# Patient Record
Sex: Female | Born: 1975
Health system: Southern US, Community
[De-identification: ages and names within clinical notes are randomized; demographics above are authoritative.]

## PROBLEM LIST (undated history)

## (undated) DIAGNOSIS — I10 Essential (primary) hypertension: Secondary | ICD-10-CM

## (undated) DIAGNOSIS — G43909 Migraine, unspecified, not intractable, without status migrainosus: Secondary | ICD-10-CM

## (undated) DIAGNOSIS — G4700 Insomnia, unspecified: Secondary | ICD-10-CM

## (undated) DIAGNOSIS — F419 Anxiety disorder, unspecified: Secondary | ICD-10-CM

## (undated) DIAGNOSIS — F329 Major depressive disorder, single episode, unspecified: Secondary | ICD-10-CM

## (undated) DIAGNOSIS — F32A Depression, unspecified: Secondary | ICD-10-CM

## (undated) DIAGNOSIS — E559 Vitamin D deficiency, unspecified: Secondary | ICD-10-CM

## (undated) DIAGNOSIS — L659 Nonscarring hair loss, unspecified: Secondary | ICD-10-CM

## (undated) HISTORY — DX: Vitamin D deficiency, unspecified: E55.9

## (undated) HISTORY — DX: Migraine, unspecified, not intractable, without status migrainosus: G43.909

## (undated) HISTORY — DX: Nonscarring hair loss, unspecified: L65.9

## (undated) HISTORY — DX: Insomnia, unspecified: G47.00

## (undated) HISTORY — DX: Essential (primary) hypertension: I10

## (undated) HISTORY — DX: Depression, unspecified: F32.A

## (undated) HISTORY — DX: Anxiety disorder, unspecified: F41.9

## (undated) HISTORY — DX: Major depressive disorder, single episode, unspecified: F32.9

---

## 2008-12-26 ENCOUNTER — Ambulatory Visit: Payer: Self-pay | Admitting: Internal Medicine

## 2008-12-26 DIAGNOSIS — J309 Allergic rhinitis, unspecified: Secondary | ICD-10-CM | POA: Insufficient documentation

## 2008-12-26 DIAGNOSIS — R51 Headache: Secondary | ICD-10-CM

## 2008-12-26 DIAGNOSIS — R519 Headache, unspecified: Secondary | ICD-10-CM | POA: Insufficient documentation

## 2008-12-26 DIAGNOSIS — I10 Essential (primary) hypertension: Secondary | ICD-10-CM | POA: Insufficient documentation

## 2008-12-26 LAB — CONVERTED CEMR LAB
ALT: <8 units/L (ref 0–35)
BUN: 12 mg/dL (ref 6–23)
Basophils Absolute: 0 10*3/uL (ref 0.0–0.1)
Basophils Relative: 0 % (ref 0–1)
Bilirubin, Direct: <0.1 mg/dL (ref 0.0–0.3)
Calcium: 9.1 mg/dL (ref 8.4–10.5)
Cholesterol: 209 mg/dL — ABNORMAL HIGH (ref 0–200)
Creatinine, Ser: 0.65 mg/dL (ref 0.40–1.20)
Eosinophils Absolute: 0.1 10*3/uL (ref 0.0–0.7)
Eosinophils Relative: 2 % (ref 0–5)
Glucose, Bld: 95 mg/dL (ref 70–99)
HCT: 36.8 % (ref 36.0–46.0)
Hemoglobin: 12.2 g/dL (ref 12.0–15.0)
MCHC: 33.2 g/dL (ref 30.0–36.0)
Monocytes Absolute: 0.3 10*3/uL (ref 0.1–1.0)
Potassium: 4.1 meq/L (ref 3.5–5.3)
RDW: 13 % (ref 11.5–15.5)
VLDL: 22 mg/dL (ref 0–40)

## 2008-12-29 ENCOUNTER — Encounter: Payer: Self-pay | Admitting: Internal Medicine

## 2009-01-05 LAB — CONVERTED CEMR LAB
Catecholamines Tot(E+NE) 24 Hr U: 0.059 mg/24hr
Dopamine 24 Hr Urine: 456 mcg/24hr (ref <500)
Metanephrines, Ur: 153 (ref 36–190)

## 2009-02-06 ENCOUNTER — Ambulatory Visit: Payer: Self-pay | Admitting: Internal Medicine

## 2009-12-28 ENCOUNTER — Telehealth: Payer: Self-pay | Admitting: Internal Medicine

## 2010-01-01 LAB — CONVERTED CEMR LAB: Pap Smear: ABNORMAL

## 2010-03-08 ENCOUNTER — Ambulatory Visit: Payer: Self-pay | Admitting: Internal Medicine

## 2010-03-08 DIAGNOSIS — R000 Tachycardia, unspecified: Secondary | ICD-10-CM | POA: Insufficient documentation

## 2010-03-08 LAB — CONVERTED CEMR LAB
BUN: 12 mg/dL (ref 6–23)
CO2: 28 meq/L (ref 19–32)
Chloride: 100 meq/L (ref 96–112)
Creatinine, Ser: 0.67 mg/dL (ref 0.40–1.20)
Free T4: 1.08 ng/dL (ref 0.80–1.80)

## 2010-03-09 ENCOUNTER — Encounter: Payer: Self-pay | Admitting: Internal Medicine

## 2010-03-09 ENCOUNTER — Telehealth: Payer: Self-pay | Admitting: Internal Medicine

## 2010-03-09 DIAGNOSIS — Z8639 Personal history of other endocrine, nutritional and metabolic disease: Secondary | ICD-10-CM

## 2010-03-10 ENCOUNTER — Ambulatory Visit: Payer: Self-pay | Admitting: Diagnostic Radiology

## 2010-03-10 ENCOUNTER — Ambulatory Visit (HOSPITAL_BASED_OUTPATIENT_CLINIC_OR_DEPARTMENT_OTHER): Admission: RE | Admit: 2010-03-10 | Discharge: 2010-03-10 | Payer: Self-pay | Admitting: Internal Medicine

## 2010-03-15 ENCOUNTER — Telehealth: Payer: Self-pay | Admitting: Internal Medicine

## 2010-03-16 ENCOUNTER — Ambulatory Visit: Payer: Self-pay | Admitting: Internal Medicine

## 2010-04-08 ENCOUNTER — Telehealth: Payer: Self-pay | Admitting: Internal Medicine

## 2010-05-17 ENCOUNTER — Ambulatory Visit: Admit: 2010-05-17 | Payer: Self-pay | Admitting: Internal Medicine

## 2010-05-18 NOTE — Progress Notes (Signed)
Summary: MRI result  Phone Note Outgoing Call   Summary of Call: [facility] for pt to call back re:  MRI of Brain results Initial call taken by: [name],  March 15, 2010 4:40 PM  Follow-up for Phone Call        call pt -  MRI of brain showed Nonspecific cerebral white matter signal changes.  these changes are sometimes seen in pts with hx of chronic migraines.  who is her headache specialist? once you find out, plz fax copy of MRI of brain to HA specialist.   Follow-up by: [name],  March 15, 2010 5:33 PM  Additional Follow-up for Phone Call Additional follow up Details #1::        Left message on machine to return my call. Nicki Guadalajara Fergerson CMA Duncan Dull)  March 16, 2010 8:41 AM     Additional Follow-up for Phone Call Additional follow up Details #2::    patient in office today for follow up appointment. She states she does not have a headache specialist that she sees. She is requesting a copy of the CT films. She was adivsed a copy of the report would be provided to her in office, and she was referred to radiology to obtain a copy of the films. Follow-up by: Glendell Docker CMA,  March 16, 2010 2:39 PM

## 2010-05-18 NOTE — Letter (Signed)
Summary: Generic Letter  [place] [facility]  [address]   [place], [place] [address]   Phone: [phone]  Fax: [phone]    11/29/201    NOVEMBER SYPHER [address] [place], [place]  [address]      Dear Ms. POSPISIL,  There have been several unsuccessful attempts to contact you regarding lab results and additional information needed. When you receive this letter, please contact our office at [phone]. If there is no one available to take your call, please leave a phone number where you can be reached.  Thank you in advance for your cooperation.         Sincerely,   Glendell Docker CMA Dr Thomos Lemons

## 2010-05-18 NOTE — Progress Notes (Signed)
Summary: Medication Alternative  Phone Note Refill Request Message from:  Fax from Pharmacy on December 28, 2009 1:54 PM  Refills Requested: Medication #1:  VERAMYST 27.5 MCG/SPRAY SUSP 1 spray each nostril once daily   Dosage confirmed as above?Dosage Confirmed   Brand Name Necessary? No   Supply Requested: 1 month   Last Refilled: 12/23/2009 [facility] [address] [place] [address] PHONE [phone] FAX [phone]  THIS MED IS TOO EXPENSIVE PATIENT WOULD LIKE A GENERIC OR CHEAPER ALTERNATIVE    Method Requested: Electronic Next Appointment Scheduled: NONE Initial call taken by: Roselle Locus,  December 28, 2009 1:56 PM  Follow-up for Phone Call        It has been almost 1 yr since prev visit.  we can not prescribe w/o OV Follow-up by: [name],  December 28, 2009 2:46 PM  Additional Follow-up for Phone Call Additional follow up Details #1::        Rx denial called/faxed to pharmacy, patient will need office visit per Dr Artist Pais instructions. Call placed to patient at  [phone], no answer. A voice message was left informing patient office visit was needed Additional Follow-up by: Glendell Docker CMA,  December 28, 2009 2:50 PM

## 2010-05-18 NOTE — Assessment & Plan Note (Signed)
Summary: HA/HEA   Vital Signs:  Patient profile:   35 year old female Height:      62.5 inches Weight:      162.25 pounds BMI:     29.31 O2 Sat:      99 % on Room air Temp:     98.4 degrees F oral Pulse rate:   104 / minute BP sitting:   130 / 86  (right arm) Cuff size:   regular  Vitals Entered By: Glendell Docker CMA (March 08, 2010 3:28 PM)  O2 Flow:  Room air CC: Headache Is Patient Diabetic? No Pain Assessment Patient in pain? no        Primary Care Provider:  Dondra Spry DO  CC:  Headache.  History of Present Illness: 56 AA female with hx of chronic migraines c/o right side head pain, kenalog injection in the base of neck and 2 areas on the sides of her head done on Friday, she states she has constant pain when she moves her head, looks down, or cough she has increased pain she also reports  episode of vertigo lasting a couple of hours,  no vomiting  Allergies (verified): No Known Drug Allergies  Past History:  Past Medical History: Headache Hypertension Allergic rhinitis   Insomnia  Past Surgical History: Denies surgical history     Family History: Family History of Arthritis Family History of CAD Female 1st degree relative <50 Family History Diabetes 1st degree relative Family History Hypertension Family History of Stroke M 1st degree relative <50      Social History: Occupation: Clinical biochemist Rep Married 11 years   1 daughter age 25   Physical Exam  General:  alert, well-developed, and well-nourished.   Head:  normocephalic and atraumatic.  right sided scalp tenderness.  no redness or swelling Eyes:  pupils equal, pupils round, and pupils reactive to light.  bilateral exopthalmos Ears:  R ear normal and L ear normal.   Mouth:  pharynx pink and moist.   Lungs:  normal respiratory effort and normal breath sounds.   Heart:  normal rate, regular rhythm, and no gallop.   Neurologic:  cranial nerves II-XII intact, gait normal, DTRs  symmetrical and normal, and finger-to-nose normal.     Impression & Recommendations:  Problem # 1:  HEADACHE (ICD-784.0) pt with right sided headache.  worse than usual migraine.  rule out intracranial lesion obtain MRI of brain  Her updated medication list for this problem includes:    Imitrex 100 Mg Tabs (Sumatriptan succinate) ..... One tablet by mouth at onset of migraine may reapeat 2 hours later if needed    Aleve 220 Mg Tabs (Naproxen sodium) .Marland Kitchen... 1 tabs by mouth bid  Orders: T-Sed Rate (Automated) ([id number]) Radiology Referral (Radiology)  Problem # 2:  HYPERTENSION (ICD-401.9) Assessment: Unchanged  Her updated medication list for this problem includes:    Lisinopril 20 Mg Tabs (Lisinopril) .Marland Kitchen... Take 1 tablet by mouth once a day  Orders: T-Basic Metabolic Panel ([phone]) T-TSH [id number]) T-T4, Free ([phone])  BP today: 130/86 Prior BP: 152/100 (12/26/2008)  Labs Reviewed: K+: 4.1 (12/26/2008) Creat: : 0.65 (12/26/2008)   Chol: 209 (12/26/2008)   HDL: 60 (12/26/2008)   LDL: 127 (12/26/2008)   TG: 108 (12/26/2008)  Problem # 3:  UNSPECIFIED TACHYCARDIA (ICD-785.0) repeat thyroid studies  Orders: T-TSH ([id number]) T-T4, Free ([id number])  Complete Medication List: 1)  Fluticasone Propionate 50 Mcg/act Susp (Fluticasone propionate) .... One spray each nostril as needed once daily 2)  Tri-previfem 0.18/0.215/0.25 Mg-35 Mcg Tabs (Norgestim-eth estrad triphasic) .... Take 1 tablet by mouth once a day 3)  Zolpidem Tartrate 10 Mg Tabs (Zolpidem tartrate) .... Take 1 tab by mouth at bedtime as needed 4)  Imitrex 100 Mg Tabs (Sumatriptan succinate) .... One tablet by mouth at onset of migraine may reapeat 2 hours later if needed 5)  Kenalog 10 Mg/ml Susp (Triamcinolone acetonide) .... Injection every other month for alopecia 6)  Lisinopril 20 Mg Tabs (Lisinopril) .... Take 1 tablet by mouth once a day 7)  Cyclobenzaprine Hcl 5 Mg Tabs (Cyclobenzaprine  hcl) .... One by mouth three times a day 8)  Aleve 220 Mg Tabs (Naproxen sodium) .Marland Kitchen.. 1 tabs by mouth bid  Patient Instructions: 1)  Please schedule a follow-up appointment in 1 week Prescriptions: CYCLOBENZAPRINE HCL 5 MG TABS (CYCLOBENZAPRINE HCL) one by mouth three times a day  #30 x 0   Entered and Authorized by:   [name]   Signed by:   [name] on 03/08/2010   Method used:   Electronically to        [facility] (retail)       [address]       [place], [place]  [address]       Ph: [phone]       Fax: [phone]   RxID:   [id number]    Orders Added: 1)  T-Sed Rate (Automated) [[id number]] 2)  T-Basic Metabolic Panel [id number] 3)  T-TSH [[id number]] 4)  T-T4, Free [[id number]] 5)  Radiology Referral [Radiology] 6)  Est. Patient Level IV [[address]3818]   Immunization History:  Influenza Immunization History:    Influenza:  declined (03/08/2010)   Contraindications/Deferment of Procedures/Staging:    Test/Procedure: FLU VAX    Reason for deferment: patient declined   Immunization History:  Influenza Immunization History:    Influenza:  Declined (03/08/2010)    Preventive Care Screening  Last Tetanus Booster:    Date:  03/08/2010    Results:  Current  Pap Smear:    Date:  01/01/2010    Results:  abnormal    Current Allergies (reviewed today): No known allergies

## 2010-05-18 NOTE — Progress Notes (Signed)
Summary: Lab Results  Phone Note Outgoing Call   Summary of Call: call pt - blood test shows pt may be hyperthyroid. Initial call taken by: [name],  March 09, 2010 9:15 AM  Follow-up for Phone Call        I suggest additional thyroid tests - thyroid iodine uptake scan  Also call lab and see if they can add thyroid antibodies Follow-up by: [name],  March 09, 2010 9:16 AM  Additional Follow-up for Phone Call Additional follow up Details #1::        Thyroid testing added, call placed to patient at [phone], no answer. A voice message was left for patient to return phone call. Additional Follow-up by: Glendell Docker CMA,  March 09, 2010 9:49 AM  New Problems: HYPERTHYROIDISM (ICD-242.90)   Additional Follow-up for Phone Call Additional follow up Details #2::    Left message on machine to return my call. Nicki Guadalajara Fergerson CMA Duncan Dull)  March 10, 2010 9:23 AM   Left message on machine to return my call. Nicki Guadalajara Fergerson CMA Duncan Dull)  March 16, 2010 8:41 AM   Additional Follow-up for Phone Call Additional follow up Details #3:: Details for Additional Follow-up Action Taken: no return call from patient  Glendell Docker CMA  March 16, 2010 10:40 AM   New Problems: HYPERTHYROIDISM (ICD-242.90)

## 2010-05-20 NOTE — Assessment & Plan Note (Signed)
Summary: 1 WEEK FOLLOW UP/MHF   Vital Signs:  Patient profile:   35 year old female Height:      62.5 inches Weight:      165 pounds BMI:     29.81 O2 Sat:      98 % on Room air Temp:     99.7 degrees F oral Pulse rate:   108 / minute Resp:     18 per minute BP sitting:   140 / 90  (right arm) Cuff size:   regular  Vitals Entered By: Glendell Docker CMA (March 16, 2010 2:32 PM)  O2 Flow:  Room air CC: 1 Week Follow up  Is Patient Diabetic? No Pain Assessment Patient in pain? no      Comments does not have a headache specialist, medication is working well   Primary Care Bricia Taher:  [name]  CC:  1 Week Follow up .  History of Present Illness: 35 y/o female for f/u re: headaches symptoms improved with use of muscle relaxer  IMPRESSION: 1.  Nonspecific cerebral white matter signal changes.  This appearance has been described as sequelae of migraines, with other differential considerations including accelerated/hereditary small vessel ischemia, sequelae of trauma, hypercoagulable state, vasculitis, prior infection or demyelination. 2.  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 3.  Proptosis of unknown etiology, no findings typical of thyroid eye disease.  recent blood work also shows mildly suppressed TSH pt notes her previous physicicans has be concerned about protosis and possible hyperthyroidism but TFTs have always been normal in the past  Preventive Screening-Counseling & Management  Alcohol-Tobacco     Smoking Status: never  Allergies: No Known Drug Allergies  Past History:  Past Medical History: Headache Hypertension Allergic rhinitis   Insomnia  Past Surgical History: Denies surgical history     Family History: Family History of Arthritis Family History of CAD Female 1st degree relative <50 Family History Diabetes 1st degree relative Family History Hypertension Family History of Stroke M 1st  degree relative <50      Social History: 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   Eyes:  bilateral proptosis Lungs:  normal respiratory effort and normal breath sounds.   Heart:  normal rate, regular rhythm, and no gallop.   Neurologic:  cranial nerves II-XII intact and gait normal.     Impression & Recommendations:  Problem # 1:  HEADACHE (ICD-784.0) Assessment Improved Pt with chronic headaches - improved with muscle relaxer IMPRESSION: 1.  Nonspecific cerebral white matter signal changes.  This appearance has been described as sequelae of migraines, with other differential considerations including accelerated/hereditary small vessel ischemia, sequelae of trauma, hypercoagulable state, vasculitis, prior infection or demyelination. 2.  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 3.  Proptosis of unknown etiology, no findings typical of thyroid eye disease.  MRI of brain findings likely explained by hx of migraines refer to headache clinic / neuro to see whether pt needs additional work up re  MRI findings  Her updated medication list for this problem includes:    Imitrex 100 Mg Tabs (Sumatriptan succinate) ..... One tablet by mouth at onset of migraine may reapeat 2 hours later if needed    Aleve 220 Mg Tabs (Naproxen sodium) .Marland Kitchen... 1 tabs by mouth bid    Metoprolol Succinate 25 Mg Xr24h-tab (Metoprolol succinate) .Marland KitchenMarland KitchenMarland KitchenMarland Kitchen  1/2 by mouth once daily x 2 weeks, then 1 by mouth once daily  Orders: Headache Clinic Referral (Headache)  Problem # 2:  HYPERTENSION (ICD-401.9) Assessment: Unchanged  still suboptimal.  add b blocker esp considering  HA and tachycardia  Her updated medication list for this problem includes:    Lisinopril 20 Mg Tabs (Lisinopril) .Marland Kitchen... Take 1 tablet by mouth once a day    Metoprolol Succinate 25 Mg  Xr24h-tab (Metoprolol succinate) .Marland Kitchen... 1/2 by mouth once daily x 2 weeks, then 1 by mouth once daily  BP today: 140/90 Prior BP: 152/100 (12/26/2008)  Labs Reviewed: K+: 4.3 (03/08/2010) Creat: : 0.67 (03/08/2010)   Chol: 209 (12/26/2008)   HDL: 60 (12/26/2008)   LDL: 127 (12/26/2008)   TG: 108 (12/26/2008)  Future Orders: T-Basic Metabolic Panel [id number]) ... 05/10/2010  Problem # 3:  HYPERTHYROIDISM (ICD-242.90) Assessment: New transient thyroiditis vs graves thyroid antibodie are negative awaiting thyroid uptake scan  Her updated medication list for this problem includes:    Metoprolol Succinate 25 Mg Xr24h-tab (Metoprolol succinate) .Marland Kitchen... 1/2 by mouth once daily x 2 weeks, then 1 by mouth once daily  Labs Reviewed: TSH: 0.252 (03/08/2010)     Complete Medication List: 1)  Fluticasone Propionate 50 Mcg/act Susp (Fluticasone propionate) .... One spray each nostril as needed once daily 2)  Tri-previfem 0.18/0.215/0.25 Mg-35 Mcg Tabs (Norgestim-eth estrad triphasic) .... Take 1 tablet by mouth once a day 3)  Zolpidem Tartrate 10 Mg Tabs (Zolpidem tartrate) .... Take 1 tab by mouth at bedtime as needed 4)  Imitrex 100 Mg Tabs (Sumatriptan succinate) .... One tablet by mouth at onset of migraine may reapeat 2 hours later if needed 5)  Kenalog 10 Mg/ml Susp (Triamcinolone acetonide) .... Injection every other month for alopecia 6)  Lisinopril 20 Mg Tabs (Lisinopril) .... Take 1 tablet by mouth once a day 7)  Cyclobenzaprine Hcl 5 Mg Tabs (Cyclobenzaprine hcl) .... One by mouth three times a day 8)  Aleve 220 Mg Tabs (Naproxen sodium) .Marland Kitchen.. 1 tabs by mouth bid 9)  Metoprolol Succinate 25 Mg Xr24h-tab (Metoprolol succinate) .... 1/2 by mouth once daily x 2 weeks, then 1 by mouth once daily  Other Orders: Future Orders: T-Lipid Profile ([id number]) ... 05/10/2010 TLB-CRP-High Sensitivity (C-Reactive Protein) ([id number]) ... 05/10/2010  Patient Instructions: 1)  Please schedule  a follow-up appointment in 2 months. 2)  BMP prior to visit, ICD-9: 401.9 3)  Lipid Panel prior to visit, ICD-9: 401.9 4)  High sensitivity CRP :  272.4 5)  Please return for lab work one (1) week before your next appointment.  Prescriptions: METOPROLOL SUCCINATE 25 MG XR24H-TAB (METOPROLOL SUCCINATE) 1/2 by mouth once daily x 2 weeks, then 1 by mouth once daily  #30 x 2   Entered and Authorized by:   [name]   Signed by:   [name] on 03/16/2010   Method used:   Electronically to        [facility] (retail)       [address]       [place], [place]  [address]       Ph: [phone]       Fax: [phone]   RxID:   [id number]    Orders Added: 1)  Headache Clinic Referral [Headache] 2)  T-Basic Metabolic Panel [[id number]] 3)  T-Lipid Profile [[id number]] 4)  TLB-CRP-High Sensitivity (C-Reactive Protein) [86140-FCRP] 5)  Est. Patient Level III [id number]

## 2010-05-20 NOTE — Progress Notes (Signed)
Summary: Thyroid Testing  Phone Note Call from Patient Call back at [phone]   Caller: Patient Call For: [name] Summary of Call: patient called stating that she declined to have thyroid testing done, and is aksing about the lab charges for the bill that she received. She was informed that she that had been contacted  with several messages that have been left, and no return call, along with a letter in the mail. She states she knew that she would be coming in for an appointment, but she states she spoke to someone regarding her lab work, and informed them that she did not want to have the testing done. Of note patient does not remember whom she spoke with.  She states that she had a conversation with Dr Artist Pais regarding the testing, and stated it was discussed in detail why she declined to have thyroid testing done. Patient was reminded there were several messages left unanswered. She would like to know what thyroid testing she had done. Initial call taken by: Glendell Docker CMA,  April 08, 2010 3:08 PM  Follow-up for Phone Call        my recollection of conversation was that I advised pt thyroid antibodies be added because of abnormal TSH. If pt has issue with bill, I suggest we reverse charges for thyroid antibodies only Follow-up by: [name],  April 08, 2010 5:16 PM  Additional Follow-up for Phone Call Additional follow up Details #1::         call placed to [facility] at [phone], spoke with  [facility] with client billing she stated that she would not be able to take a verbal credit requst over the phone, I would have to contact our Client Customer Service Rep. Gae Dry also stated that insurance did not pay on $10. 74 of bill, so no refund would be due.  Call was placed to [facility] at [phone], no answer. A detailed voice message was left informing Vicky of request for Credit  accession number [id number] per Dr Artist Pais. Call was returned to patient at [phone] answer. A  detailed voice message was left informing patient, process has been started to issue a credit for thyroid antibodies. Additional Follow-up by: Glendell Docker CMA,  April 09, 2010 8:59 AM

## 2010-07-12 ENCOUNTER — Emergency Department (HOSPITAL_BASED_OUTPATIENT_CLINIC_OR_DEPARTMENT_OTHER)
Admission: EM | Admit: 2010-07-12 | Discharge: 2010-07-13 | Disposition: A | Discharge status: Home / Self Care | Payer: 59 | Attending: Emergency Medicine | Admitting: Emergency Medicine

## 2010-07-12 DIAGNOSIS — I10 Essential (primary) hypertension: Secondary | ICD-10-CM | POA: Insufficient documentation

## 2010-07-12 DIAGNOSIS — R220 Localized swelling, mass and lump, head: Secondary | ICD-10-CM | POA: Insufficient documentation

## 2010-07-12 DIAGNOSIS — X58XXXA Exposure to other specified factors, initial encounter: Secondary | ICD-10-CM | POA: Insufficient documentation

## 2010-07-12 DIAGNOSIS — T783XXA Angioneurotic edema, initial encounter: Secondary | ICD-10-CM | POA: Insufficient documentation

## 2010-07-12 DIAGNOSIS — Y9229 Other specified public building as the place of occurrence of the external cause: Secondary | ICD-10-CM | POA: Insufficient documentation

## 2011-03-14 ENCOUNTER — Encounter: Payer: Self-pay | Admitting: Internal Medicine

## 2011-03-14 ENCOUNTER — Ambulatory Visit (INDEPENDENT_AMBULATORY_CARE_PROVIDER_SITE_OTHER): Payer: 59 | Admitting: Internal Medicine

## 2011-03-14 VITALS — BP 132/80 | HR 104 | Temp 98.4°F | Resp 18 | Ht 62.0 in | Wt 167.0 lb

## 2011-03-14 DIAGNOSIS — J029 Acute pharyngitis, unspecified: Secondary | ICD-10-CM | POA: Insufficient documentation

## 2011-03-14 NOTE — Assessment & Plan Note (Signed)
Rapid strep obtained and negative. Suspect viral etiology currently. Continue otc sx relief prn. Followup if no improvement or worsening.

## 2011-03-14 NOTE — Progress Notes (Signed)
  Subjective:    Patient ID: Lindsey Burton, female    DOB: Jul 04, 1975, 35 y.o.   MRN: [id number]  HPI Pt presents to clinic for evaluation of sore throat. Notes 2day h/o ST, dry and irritated. No know strep exposure but has had general sick exposure. Denies f/c, myalgias, cough. Mild improvement with theraful or robutussin. No other alleviating or exacerbating factors.  Reviewed pmh, medications and allergies  Review of Systems see hpi     Objective:   Physical Exam  Nursing note and vitals reviewed. Constitutional: She appears well-developed and well-nourished. No distress.  HENT:  Head: Normocephalic and atraumatic.  Right Ear: External ear normal.  Left Ear: External ear normal.  Nose: Nose normal.  Mouth/Throat: Uvula is midline and mucous membranes are normal. Posterior oropharyngeal erythema present. No oropharyngeal exudate, posterior oropharyngeal edema or tonsillar abscesses.  Eyes: Conjunctivae are normal. Right eye exhibits discharge. Left eye exhibits no discharge. No scleral icterus.  Neck: Neck supple.  Neurological: She is alert.  Skin: Skin is warm and dry. She is not diaphoretic.  Psychiatric: She has a normal mood and affect.          Assessment & Plan:

## 2011-03-16 ENCOUNTER — Ambulatory Visit (INDEPENDENT_AMBULATORY_CARE_PROVIDER_SITE_OTHER): Payer: 59 | Admitting: Family

## 2011-03-16 ENCOUNTER — Encounter: Payer: Self-pay | Admitting: Family

## 2011-03-16 DIAGNOSIS — J329 Chronic sinusitis, unspecified: Secondary | ICD-10-CM

## 2011-03-16 DIAGNOSIS — I10 Essential (primary) hypertension: Secondary | ICD-10-CM

## 2011-03-16 MED ORDER — AMOXICILLIN-POT CLAVULANATE 875-125 MG PO TABS: 1.0000 | ORAL_TABLET | Freq: Two times a day (BID) | ORAL | Status: AC

## 2011-03-16 MED ORDER — AMLODIPINE BESYLATE 5 MG PO TABS: 5.0000 mg | ORAL_TABLET | Freq: Every day | ORAL | Status: DC

## 2011-03-16 NOTE — Patient Instructions (Signed)

## 2011-03-16 NOTE — Assessment & Plan Note (Signed)
Deteriorated.  Add amlodipine, She is instructed to follow up in 2 weeks and avoid sudafed containing products.

## 2011-03-16 NOTE — Assessment & Plan Note (Signed)
Plan to treat with augmentin.

## 2011-03-16 NOTE — Progress Notes (Signed)
  Subjective:    Patient ID: Lindsey Burton, female    DOB: 1976-01-28, 35 y.o.   MRN: [id number]  HPI  [name] is a 35 yr old female who presents today with chief complaint of facial pain and sinus pressure.  Tried alka selzer, robitussin without improvement.  Yellow/green nasal drainage.  Denies ear pain/appetite.     Review of Systems See HPI  No past medical history on file.  History   Social History  . Marital Status: Married    Spouse Name: N/A    Number of Children: N/A  . Years of Education: N/A   Occupational History  . Not on file.   Social History Main Topics  . Smoking status: Never Smoker   . Smokeless tobacco: Never Used  . Alcohol Use: No  . Drug Use: No  . Sexually Active: Not on file   Other Topics Concern  . Not on file   Social History Narrative  . No narrative on file    No past surgical history on file.  No family history on file.  Allergies  Allergen Reactions  . Lisinopril Swelling    Current Outpatient Prescriptions on File Prior to Visit  Medication Sig Dispense Refill  . fluticasone (FLONASE) 50 MCG/ACT nasal spray Place 2 sprays into the nose daily.        . metoprolol (TOPROL-XL) 100 MG 24 hr tablet Take 100 mg by mouth daily.        . SUMAtriptan (IMITREX) 100 MG tablet Take 100 mg by mouth every 2 (two) hours as needed.        . Triamcinolone Acetonide (KENALOG IJ) Inject as directed. Every 3 months for Alopecia       . zolpidem (AMBIEN CR) 12.5 MG CR tablet Take 12.5 mg by mouth at bedtime as needed.          BP 164/118  Pulse 110  Temp(Src) 98 F (36.7 C) (Oral)  Resp 18  Wt 165 lb 1.9 oz (74.898 kg)  SpO2 98%  LMP 02/28/2011       Objective:   Physical Exam  Constitutional: She appears well-developed and well-nourished. No distress.  HENT:  Head: Normocephalic and atraumatic.  Right Ear: Tympanic membrane and ear canal normal.  Left Ear: Tympanic membrane and ear canal normal.       + frontal and  maxillary sinus tenderness to palpation.   Cardiovascular: Normal rate and regular rhythm.   No murmur heard. Pulmonary/Chest: Effort normal and breath sounds normal. No respiratory distress. She has no wheezes. She has no rales. She exhibits no tenderness.  Musculoskeletal: She exhibits no edema.  Psychiatric: She has a normal mood and affect. Her behavior is normal. Judgment and thought content normal.          Assessment & Plan:

## 2012-02-27 ENCOUNTER — Ambulatory Visit (INDEPENDENT_AMBULATORY_CARE_PROVIDER_SITE_OTHER): Payer: 59 | Admitting: Internal Medicine

## 2012-02-27 ENCOUNTER — Encounter: Payer: Self-pay | Admitting: Internal Medicine

## 2012-02-27 VITALS — BP 164/102 | HR 114 | Temp 98.3°F | Resp 16 | Wt 169.0 lb

## 2012-02-27 DIAGNOSIS — I10 Essential (primary) hypertension: Secondary | ICD-10-CM

## 2012-02-27 DIAGNOSIS — J329 Chronic sinusitis, unspecified: Secondary | ICD-10-CM

## 2012-02-27 MED ORDER — AMOXICILLIN-POT CLAVULANATE 875-125 MG PO TABS: 1.0000 | ORAL_TABLET | Freq: Two times a day (BID) | ORAL | Status: DC

## 2012-02-27 NOTE — Progress Notes (Signed)
  Subjective:    Patient ID: Lindsey Burton, female    DOB: [birth year], 36 y.o.   MRN: [id number]  HPI patient presents to clinic for evaluation of possible sinusitis. Notes a day history of bilateral maxillary sinus pain and pressure, sore throat, bilateral ear fullness and cough productive for green brown sputum without hemoptysis. Denies fever or chills. Is taking over-the-counter cold medication. Reviewed elevated blood pressure. States has not taken blood pressure medication for the past several days. Has a primary care physician that follows her blood pressure and is scheduled for followup in approximately one week.  PMH:HTN No past surgical history on file.  reports that she has never smoked. She has never used smokeless tobacco. She reports that she does not drink alcohol or use illicit drugs. family history is not on file. Allergies  Allergen Reactions  . Lisinopril Swelling     Review of Systems see hpi     Objective:   Physical Exam  Nursing note and vitals reviewed. Constitutional: She appears well-developed and well-nourished. No distress.  HENT:  Head: Normocephalic and atraumatic.  Right Ear: External ear normal.  Left Ear: External ear normal.  Nose: Right sinus exhibits maxillary sinus tenderness. Right sinus exhibits no frontal sinus tenderness. Left sinus exhibits maxillary sinus tenderness. Left sinus exhibits no frontal sinus tenderness.  Mouth/Throat: Oropharynx is clear and moist.  Eyes: Conjunctivae normal and EOM are normal. No scleral icterus.  Neck: Neck supple.  Pulmonary/Chest: Effort normal and breath sounds normal. No respiratory distress. She has no wheezes. She has no rales.  Skin: Skin is warm and dry. She is not diaphoretic.  Psychiatric: She has a normal mood and affect.          Assessment & Plan:

## 2012-02-27 NOTE — Assessment & Plan Note (Signed)
Asymptomatic. Likely contributed by her sinusitis, cold medication and lack of antihypertensive medication for several days. Recommend resuming medication and followup with PCP closely.

## 2012-02-27 NOTE — Assessment & Plan Note (Signed)
Begin seven-day course of Augmentin. Followup if no improvement or worsening.

## 2013-02-18 ENCOUNTER — Ambulatory Visit (INDEPENDENT_AMBULATORY_CARE_PROVIDER_SITE_OTHER): Payer: 59 | Admitting: Family

## 2013-02-18 ENCOUNTER — Other Ambulatory Visit: Payer: Self-pay | Admitting: Family

## 2013-02-18 ENCOUNTER — Encounter: Payer: Self-pay | Admitting: Family

## 2013-02-18 VITALS — BP 130/60 | HR 67 | Temp 98.1°F | Resp 16 | Ht 63.0 in | Wt 166.1 lb

## 2013-02-18 DIAGNOSIS — F32A Depression, unspecified: Secondary | ICD-10-CM | POA: Insufficient documentation

## 2013-02-18 DIAGNOSIS — E0590 Thyrotoxicosis, unspecified without thyrotoxic crisis or storm: Secondary | ICD-10-CM

## 2013-02-18 DIAGNOSIS — F411 Generalized anxiety disorder: Secondary | ICD-10-CM

## 2013-02-18 DIAGNOSIS — G43909 Migraine, unspecified, not intractable, without status migrainosus: Secondary | ICD-10-CM

## 2013-02-18 DIAGNOSIS — L659 Nonscarring hair loss, unspecified: Secondary | ICD-10-CM

## 2013-02-18 DIAGNOSIS — F329 Major depressive disorder, single episode, unspecified: Secondary | ICD-10-CM | POA: Insufficient documentation

## 2013-02-18 DIAGNOSIS — F419 Anxiety disorder, unspecified: Secondary | ICD-10-CM

## 2013-02-18 DIAGNOSIS — I10 Essential (primary) hypertension: Secondary | ICD-10-CM

## 2013-02-18 DIAGNOSIS — Z0000 Encounter for general adult medical examination without abnormal findings: Secondary | ICD-10-CM | POA: Insufficient documentation

## 2013-02-18 LAB — BASIC METABOLIC PANEL WITH GFR
BUN: 11 mg/dL (ref 6–23)
CO2: 29 mEq/L (ref 19–32)
Chloride: 102 mEq/L (ref 96–112)
Glucose, Bld: 81 mg/dL (ref 70–99)
Potassium: 4.2 mEq/L (ref 3.5–5.3)

## 2013-02-18 LAB — TSH: TSH: 0.781 u[IU]/mL (ref 0.350–4.500)

## 2013-02-18 LAB — LIPID PANEL
Cholesterol: 204 mg/dL — ABNORMAL HIGH (ref 0–200)
VLDL: 22 mg/dL (ref 0–40)

## 2013-02-18 LAB — CBC WITH DIFFERENTIAL/PLATELET
Basophils Relative: 0 % (ref 0–1)
Eosinophils Absolute: 0.1 10*3/uL (ref 0.0–0.7)
HCT: 39.7 % (ref 36.0–46.0)
Hemoglobin: 13.2 g/dL (ref 12.0–15.0)
MCH: 29.1 pg (ref 26.0–34.0)
MCHC: 33.2 g/dL (ref 30.0–36.0)
MCV: 87.6 fL (ref 78.0–100.0)
Monocytes Absolute: 0.5 10*3/uL (ref 0.1–1.0)
Monocytes Relative: 5 % (ref 3–12)

## 2013-02-18 LAB — T3, FREE: T3, Free: 2.9 pg/mL (ref 2.3–4.2)

## 2013-02-18 LAB — HEPATIC FUNCTION PANEL
ALT: 9 U/L (ref 0–35)
AST: 11 U/L (ref 0–37)
Albumin: 4.3 g/dL (ref 3.5–5.2)
Alkaline Phosphatase: 126 U/L — ABNORMAL HIGH (ref 39–117)
Bilirubin, Direct: 0.1 mg/dL (ref 0.0–0.3)

## 2013-02-18 LAB — T4, FREE: Free T4: 1.04 ng/dL (ref 0.80–1.80)

## 2013-02-18 MED ORDER — ZOLPIDEM TARTRATE ER 12.5 MG PO TBCR: 12.5000 mg | EXTENDED_RELEASE_TABLET | Freq: Every evening | ORAL | Status: DC | PRN

## 2013-02-18 MED ORDER — SUMATRIPTAN SUCCINATE 100 MG PO TABS: ORAL_TABLET | ORAL | Status: DC

## 2013-02-18 MED ORDER — FLUTICASONE PROPIONATE 50 MCG/ACT NA SUSP: 2.0000 | Freq: Every day | NASAL | Status: DC

## 2013-02-18 MED ORDER — METOPROLOL SUCCINATE ER 100 MG PO TB24: 100.0000 mg | ORAL_TABLET | Freq: Every day | ORAL | Status: DC

## 2013-02-18 NOTE — Assessment & Plan Note (Signed)
I think she would benefit from meeting with a therapist.  She is open to the idea but does not think that she has the time to do so but will think about it and let me know if she would like to proceed.

## 2013-02-18 NOTE — Progress Notes (Signed)
Subjective:    Patient ID: Lindsey Burton, female    DOB: 03-Mar-1976, 37 y.o.   MRN: [id number]  HPI  Reports hx of exopthalmos and decreased TSH 2011.    Patient presents today for complete physical.  Immunizations:declines flu shot, last tetanus 2010.  Diet: poor eats too much fried food Exercise: stationary bike 3 times a week 25-30 minutes.   Pap Smear: normal 2013. Mammogram: never  Migraines- reports 9 migraines a month. Improved since she has been working part time.  HTN- she is maintained on metoprolol.    Anxiety- reports family has advised her to seek eval for anxiety.  Reports she is generally happy but can "fly off the handle."  Reports that all of the family comes to her with all her problems.  Reports that she raises her voice.  Reports that this is rare.  Has excessive worry about her autistic daughter.  Reports that she uses ambien PRN.    Alopecia- sees Derm for kenalog injections- Sees Derm in [place].    Review of Systems  HENT: Negative for hearing loss and rhinorrhea.   Eyes:       Has eye apt.  Reports that her vision is getting worse  Respiratory: Negative for cough and shortness of breath.   Cardiovascular: Negative for chest pain.  Genitourinary: Negative for dysuria.  Musculoskeletal: Negative for arthralgias and myalgias.  Neurological: Positive for headaches.   Past Medical History  Diagnosis Date  . Hypertension   . Migraines     History   Social History  . Marital Status: Married    Spouse Name: N/A    Number of Children: N/A  . Years of Education: N/A   Occupational History  . Not on file.   Social History Main Topics  . Smoking status: Never Smoker   . Smokeless tobacco: Never Used  . Alcohol Use: No  . Drug Use: No  . Sexual Activity: Not on file   Other Topics Concern  . Not on file   Social History Narrative   Daughter, age 67 autistic   Works at [employer]   Enjoys TV   No pets    History reviewed. No  pertinent past surgical history.  Family History  Problem Relation Age of Onset  . Hypertension Father   . Diabetes Father   . Hyperlipidemia Father   . Heart attack Father   . Diabetes Sister   . Cancer Neg Hx     Allergies  Allergen Reactions  . Lisinopril Swelling    Current Outpatient Prescriptions on File Prior to Visit  Medication Sig Dispense Refill  . amLODipine (NORVASC) 5 MG tablet Take 1 tablet (5 mg total) by mouth daily.  30 tablet  0  . amoxicillin-clavulanate (AUGMENTIN) 875-125 MG per tablet Take 1 tablet by mouth 2 (two) times daily.  14 tablet  0  . Triamcinolone Acetonide (KENALOG IJ) Inject as directed. Every 3 months for Alopecia        No current facility-administered medications on file prior to visit.    BP 130/60  Pulse 67  Temp(Src) 98.1 F (36.7 C) (Oral)  Resp 16  Ht 5\' 3"  (1.6 m)  Wt 166 lb 1.3 oz (75.333 kg)  BMI 29.43 kg/m2  SpO2 99%  LMP 01/26/2013        Objective:   Physical Exam  Physical Exam  Constitutional: She is oriented to person, place, and time. She appears well-developed and well-nourished. No distress.  HENT:  Head: Normocephalic and atraumatic.  Right Ear: Tympanic membrane and ear canal normal.  Left Ear: Tympanic membrane and ear canal normal.  Mouth/Throat: Oropharynx is clear and moist.  Eyes: Pupils are equal, round, and reactive to light. No scleral icterus. + bilateral Exopthalmos Neck: Normal range of motion. No thyromegaly present.  Cardiovascular: Normal rate and regular rhythm.   No murmur heard. Pulmonary/Chest: Effort normal and breath sounds normal. No respiratory distress. He has no wheezes. She has no rales. She exhibits no tenderness.  Abdominal: Soft. Bowel sounds are normal. He exhibits no distension and no mass. There is no tenderness. There is no rebound and no guarding.  Musculoskeletal: She exhibits no edema.  Lymphadenopathy:    She has no cervical adenopathy.  Neurological: She is alert  and oriented to person, place, and time. She exhibits normal muscle tone. Coordination normal.  Skin: Skin is warm and dry.  Psychiatric: She has a normal mood and affect. Her behavior is normal. Judgment and thought content normal.  Breasts: Examined lying Right: Without masses, retractions, discharge or axillary adenopathy.  Left: Without masses, retractions, discharge or axillary adenopathy.  Pelvic deferred         Assessment & Plan:         Assessment & Plan:

## 2013-02-18 NOTE — Assessment & Plan Note (Signed)
Trial of imitrex, continue beta blocker.

## 2013-02-18 NOTE — Assessment & Plan Note (Signed)
Receiving kenalog injections with derm and reports improvement.

## 2013-02-18 NOTE — Patient Instructions (Signed)
Please complete lab work prior to leaving. Follow up in 3 months.  

## 2013-02-18 NOTE — Assessment & Plan Note (Signed)
BP Readings from Last 3 Encounters:  02/18/13 130/60  02/27/12 164/102  03/16/11 164/118   BP stable today.  Continue beta blocker.

## 2013-02-18 NOTE — Assessment & Plan Note (Signed)
Continue exercise. Discussed healthy diet, weight loss.  Obtain Mammogram, plan pap next year.  Discussed BSE, obtain fasting lab work.

## 2013-02-18 NOTE — Assessment & Plan Note (Signed)
Check TFTs today

## 2013-02-19 LAB — URINALYSIS, ROUTINE W REFLEX MICROSCOPIC
Ketones, ur: NEGATIVE mg/dL
Nitrite: NEGATIVE
Protein, ur: NEGATIVE mg/dL
Specific Gravity, Urine: 1.021 (ref 1.005–1.030)
Urobilinogen, UA: 0.2 mg/dL (ref 0.0–1.0)

## 2013-02-19 LAB — LIPID PANEL
Cholesterol: 205 mg/dL — ABNORMAL HIGH (ref 0–200)
LDL Cholesterol: 142 mg/dL — ABNORMAL HIGH (ref 0–99)
Total CHOL/HDL Ratio: 4.8 Ratio
VLDL: 20 mg/dL (ref 0–40)

## 2013-02-22 ENCOUNTER — Telehealth: Payer: Self-pay | Admitting: *Deleted

## 2013-02-22 NOTE — Telephone Encounter (Signed)
Per note from Provider, correction to below results: LFT's elevated (alk phos), possible related to gallbladder. Repeat LFT in 1 month, dx elevated alk. Phos.  Left message for pt to return my call.  Result Note    Please call pt and let her know that kidney function, electrolytes, sugar, blood count, urine, liver testing are all normal. Cholesterol mildly elevated. Work on low fat/low cholesterol diet and exercise. HIV screen neg.

## 2013-02-25 NOTE — Telephone Encounter (Signed)
Notified pt and she voices understanding. Lab order entered. Pt states she just passed a kidney stone and wanted to know if that could have caused her elevated alk. Phos?

## 2013-05-20 ENCOUNTER — Ambulatory Visit: Payer: 59 | Admitting: Family

## 2013-08-11 ENCOUNTER — Emergency Department (HOSPITAL_BASED_OUTPATIENT_CLINIC_OR_DEPARTMENT_OTHER)
Admission: EM | Admit: 2013-08-11 | Discharge: 2013-08-11 | Disposition: A | Discharge status: Home / Self Care | Payer: 59 | Attending: Emergency Medicine | Admitting: Emergency Medicine

## 2013-08-11 ENCOUNTER — Encounter (HOSPITAL_BASED_OUTPATIENT_CLINIC_OR_DEPARTMENT_OTHER): Payer: Self-pay | Admitting: Emergency Medicine

## 2013-08-11 DIAGNOSIS — R209 Unspecified disturbances of skin sensation: Secondary | ICD-10-CM | POA: Insufficient documentation

## 2013-08-11 DIAGNOSIS — T450X4A Poisoning by antiallergic and antiemetic drugs, undetermined, initial encounter: Secondary | ICD-10-CM | POA: Insufficient documentation

## 2013-08-11 DIAGNOSIS — T50901A Poisoning by unspecified drugs, medicaments and biological substances, accidental (unintentional), initial encounter: Secondary | ICD-10-CM

## 2013-08-11 DIAGNOSIS — G43909 Migraine, unspecified, not intractable, without status migrainosus: Secondary | ICD-10-CM | POA: Insufficient documentation

## 2013-08-11 DIAGNOSIS — IMO0002 Reserved for concepts with insufficient information to code with codable children: Secondary | ICD-10-CM | POA: Insufficient documentation

## 2013-08-11 DIAGNOSIS — I10 Essential (primary) hypertension: Secondary | ICD-10-CM | POA: Insufficient documentation

## 2013-08-11 DIAGNOSIS — J3489 Other specified disorders of nose and nasal sinuses: Secondary | ICD-10-CM | POA: Insufficient documentation

## 2013-08-11 DIAGNOSIS — T44901A Poisoning by unspecified drugs primarily affecting the autonomic nervous system, accidental (unintentional), initial encounter: Secondary | ICD-10-CM | POA: Insufficient documentation

## 2013-08-11 DIAGNOSIS — R002 Palpitations: Secondary | ICD-10-CM | POA: Insufficient documentation

## 2013-08-11 DIAGNOSIS — Y9389 Activity, other specified: Secondary | ICD-10-CM | POA: Insufficient documentation

## 2013-08-11 DIAGNOSIS — R000 Tachycardia, unspecified: Secondary | ICD-10-CM | POA: Insufficient documentation

## 2013-08-11 DIAGNOSIS — T4591XA Poisoning by unspecified primarily systemic and hematological agent, accidental (unintentional), initial encounter: Secondary | ICD-10-CM | POA: Insufficient documentation

## 2013-08-11 DIAGNOSIS — H0019 Chalazion unspecified eye, unspecified eyelid: Secondary | ICD-10-CM | POA: Insufficient documentation

## 2013-08-11 DIAGNOSIS — Y929 Unspecified place or not applicable: Secondary | ICD-10-CM | POA: Insufficient documentation

## 2013-08-11 DIAGNOSIS — Z79899 Other long term (current) drug therapy: Secondary | ICD-10-CM | POA: Insufficient documentation

## 2013-08-11 DIAGNOSIS — T391X1A Poisoning by 4-Aminophenol derivatives, accidental (unintentional), initial encounter: Secondary | ICD-10-CM | POA: Insufficient documentation

## 2013-08-11 DIAGNOSIS — H0011 Chalazion right upper eyelid: Secondary | ICD-10-CM

## 2013-08-11 MED ORDER — SODIUM CHLORIDE 0.9 % IV BOLUS (SEPSIS)
1000.0000 mL | Freq: Once | INTRAVENOUS | Status: AC
Start: 2013-08-11 — End: 2013-08-11
  Administered 2013-08-11: 1000 mL via INTRAVENOUS

## 2013-08-11 NOTE — ED Notes (Signed)
Ambulated to BR w/o assist gait steady pt reports pain improved

## 2013-08-11 NOTE — Discharge Instructions (Signed)
Chalazion A chalazion is a swelling or hard lump on the eyelid caused by a blocked oil gland. Chalazions may occur on the upper or the lower eyelid.  CAUSES  Oil gland in the eyelid becomes blocked. SYMPTOMS   Swelling or hard lump on the eyelid. This lump may make it hard to see out of the eye.  The swelling may spread to areas around the eye. TREATMENT   Although some chalazions disappear by themselves in 1 or 2 months, some chalazions may need to be removed.  Medicines to treat an infection may be required. HOME CARE INSTRUCTIONS   Wash your hands often and dry them with a clean towel. Do not touch the chalazion.  Apply heat to the eyelid several times a day for 10 minutes to help ease discomfort and bring any yellowish white fluid (pus) to the surface. One way to apply heat to a chalazion is to use the handle of a metal spoon.  Hold the handle under hot water until it is hot, and then wrap the handle in paper towels so that the heat can come through without burning your skin.  Hold the wrapped handle against the chalazion and reheat the spoon handle as needed.  Apply heat in this fashion for 10 minutes, 4 times per day.  Return to your caregiver to have the pus removed if it does not break (rupture) on its own.  Do not try to remove the pus yourself by squeezing the chalazion or sticking it with a pin or needle.  Only take over-the-counter or prescription medicines for pain, discomfort, or fever as directed by your caregiver. SEEK IMMEDIATE MEDICAL CARE IF:   You have pain in your eye.  Your vision changes.  The chalazion does not go away.  The chalazion becomes painful, red, or swollen, grows larger, or does not start to disappear after 2 weeks. MAKE SURE YOU:   Understand these instructions.  Will watch your condition.  Will get help right away if you are not doing well or get worse. Document Released: 04/01/2000 Document Revised: 06/27/2011 Document Reviewed:  07/20/2009 [facility] Patient Information 2014 [place], [place].  Overdose, Adult A person can overdose on alcohol, drugs or both by accident or on purpose. If it was on purpose, it is a serious matter. Professional help should be sought. If the overdose was an accident, certain steps should be taken to make sure that it never happens again. ACCIDENTAL OVERDOSE Overdosing on prescription medications can be a result of:  Not understanding the instructions.  Misreading the label.  Forgetting that you took a dose and then taking another by mistake. This situation happens a lot. To make sure this does not happen again:  Clarify the correct dosage with your caregiver.  Place the correct dosage in a "pill-minder" container (labeled for each day and time of day).  Have someone dispense your medicine. Please be sure to follow-up with your primary care doctor as directed. INTENTIONAL OVERDOSE If the overdose was on purpose, it is a serious situation. Taking more than the prescribed amount of medications (including taking someone else's prescription), abusing street drugs or drinking an amount of alcohol that requires medical treatment can show a variety of possible problems. These may indicate you:  Are depressed or suicidal.  Are abusing drugs, took too much or combined different drugs to experiment with the effects.  Mixed alcohol with drugs and did not realize the danger of doing so (this is drug abuse).  Are suffering addiction  to drugs and/or alcohol (also known as chemical dependency).  Binge drink. If you have not been referred to a mental health professional for help, it is important that you get help right away. Only a professional can determine which problems may exist and what the best course of treatment may be. It is your responsibility to follow-up with further evaluation or treatment as directed.  Alcohol is responsible for a large number of overdoses and unintended deaths among  college-age young adults. Binge drinking is consuming 4-5 drinks in a short period of time. The amount of alcohol in standard servings of wine (5 oz.), beer (12 oz.) and distilled spirits (1.5 oz., 80 proof) is the same. Beer or wine can be just as dangerous to the binge drinker as "hard" liquor can be.  CONSEQUENCES OF BINGE DRINKING Alcohol poisoning is the most serious consequence of binge drinking. This is a severe and potentially fatal physical reaction to an alcohol overdose. When too much alcohol is consumed, the brain does not get enough oxygen. The lack of oxygen will eventually cause the brain to shut down the voluntary functions that regulate breathing and heart rate. Symptoms of alcohol poisoning include:  Vomiting.  Passing out (unconsciousness).  Cold, clammy, pale or bluish skin.  Slow or irregular breathing. WHAT SHOULD I DO NEXT? If you have a history of drug abuse or suffer chemical dependency (alcoholism, drug addiction or both), you might consider the following:  Talk with a qualified substance abuse counselor and consider entering a treatment program.  Go to a detox facility if necessary.  If you were attending self-help group meetings, consider returning to them and go often.  Explore other resources located near you (see sources listed below). If you are unsure if you have a substance abuse problem, ask yourself the following questions:  Have you been told by friends or family that drugs/alcohol has become a problem?  Do you get into fights when drinking or using drugs?  Do you have blackouts (not remembering what you do while using)?  Do you lie about use or amounts of drugs or alcohol you consume?  Do you need chemicals to get you going?  Do you suffer in work or school performance because of drug or alcohol use?  Do you get sick from drug or alcohol use but continue to use anyway?  Do you need drugs or alcohol to relate to people or feel comfortable in  social situations?  Do you use drugs or alcohol to forget problems? If you answered "Yes" to any of the above questions, it means you show signs of chemical dependency and a professional evaluation is suggested. The longer the use of drugs and alcohol continues, the problems will become greater. SEEK IMMEDIATE MEDICAL CARE IF:   You feel like you might repeat your problematic behavior.  You need someone to talk to and feel that it should not wait.  You feel you are a danger to yourself or someone else.  You feel like you are having a new reaction to medications you are taking, or you are getting worse after leaving a care center.  You have an overwhelming urge to drink or use drugs. Addiction cannot be cured, but it can be treated successfully. Treatment centers are listed in the yellow pages under: Cocaine, Narcotics, and Alcoholics Anonymous. Most hospitals and clinics can refer you to a specialized care center. The [place] government maintains a toll-free number for obtaining treatment referrals: 276 289 8814 or 760-310-1089 (TDD) and maintains  a website: http://findtreatment.Rock[place].plsamhsa.gov. Other websites for additional information are: www.mentalhealth.[place]sam[place]sa.gov. and [place]est[place]eling.tnwww.[place].gov. In [place] treatment resources are listed in each [place] with listings available under [employer] [facility] [employer] or similar titles. Document Released: 04/07/2003 Document Revised: 06/27/2011 Document Reviewed: 02/27/2008 [facility] Patient Information 2014 [place]Care, [place].

## 2013-08-11 NOTE — ED Provider Notes (Signed)
CSN: [id number]     Arrival date & time 08/11/13  1801 History  This chart was scribed for Rolan BuccoMelanie Arianny Pun, MD by Danella Maiersaroline Early, ED Scribe. This patient was seen in room MH08/MH08 and the patient's care was started at 6:17 PM.    Chief Complaint  Patient presents with  . Eye Pain  . Drug Overdose   The history is provided by the patient. No language interpreter was used.   HPI Comments: Lindsey Burton is a 38 y.o. female who presents to the Emergency Department complaining of accidental drug overdose on allergy medications while trying to treat swelling and irritation of the right eye. Pt has had 1.5 bottles of children's benadryl (442.5 mg) since yesterday, half of a bottle of children's zyrtec (30 mg) since yesterday, and a 10 mg loratadine tablet. She is now having numbness and tingling to bilateral hands and feet and rapid heart beat. She did not have any other medications, drugs, or alcohol.  She states her eye started tingling 2 days ago and started swelling that night. She reports irritation but no itching. She reports mild yellow discharge from the eye. No crust. She also reports rhinorrhea. She states she has had these symptoms before but it usually goes away with benadryl and zyrtec. She states she usually drinks half the bottle without measuring the dose. She denies fevers, visual changes, recent illness, vomiting, diarrhea. LMP 4/6.    Past Medical History  Diagnosis Date  . Hypertension   . Migraines    History reviewed. No pertinent past surgical history. Family History  Problem Relation Age of Onset  . Hypertension Father   . Diabetes Father   . Hyperlipidemia Father   . Heart attack Father   . Diabetes Sister   . Cancer Neg Hx    History  Substance Use Topics  . Smoking status: Never Smoker   . Smokeless tobacco: Never Used  . Alcohol Use: No   OB History   Grav Para Term Preterm Abortions TAB SAB Ect Mult Living                 Review of Systems   Constitutional: Negative for fever, chills, diaphoresis and fatigue.  HENT: Negative for congestion, rhinorrhea and sneezing.   Eyes: Positive for pain, discharge and redness. Negative for photophobia, itching and visual disturbance.  Respiratory: Negative for cough, chest tightness and shortness of breath.   Cardiovascular: Positive for palpitations. Negative for chest pain and leg swelling.  Gastrointestinal: Negative for nausea, vomiting, abdominal pain, diarrhea and blood in stool.  Genitourinary: Negative for frequency, hematuria, flank pain and difficulty urinating.  Musculoskeletal: Negative for arthralgias and back pain.  Skin: Negative for rash.  Neurological: Positive for numbness. Negative for dizziness, speech difficulty, weakness and headaches.      Allergies  Lisinopril  Home Medications   Prior to Admission medications   Medication Sig Start Date End Date Taking? Authorizing Provider  amLODipine (NORVASC) 5 MG tablet Take 1 tablet (5 mg total) by mouth daily. 03/16/11 03/15/12  Sandford CrazeMelissa O'Sullivan, NP  fluticasone (FLONASE) 50 MCG/ACT nasal spray Place 2 sprays into the nose daily. 02/18/13   Sandford CrazeMelissa O'Sullivan, NP  metoprolol succinate (TOPROL-XL) 100 MG 24 hr tablet Take 1 tablet (100 mg total) by mouth daily. 02/18/13   Sandford CrazeMelissa O'Sullivan, NP  SUMAtriptan (IMITREX) 100 MG tablet One tab at start of headache, may repeat 2 hours later as needed. Max 2 dose per 24 hrs 02/18/13   Sandford CrazeMelissa O'Sullivan, NP  Triamcinolone Acetonide (KENALOG IJ) Inject as directed. Every 3 months for Alopecia     Historical Provider, MD  zolpidem (AMBIEN CR) 12.5 MG CR tablet Take 1 tablet (12.5 mg total) by mouth at bedtime as needed. 02/18/13   Sandford CrazeMelissa O'Sullivan, NP   BP 176/113  Pulse 133  Temp(Src) 98.6 F (37 C) (Oral)  Resp 20  Ht 5\' 2"  (1.575 m)  Wt 164 lb (74.39 kg)  BMI 29.99 kg/m2  SpO2 100%  LMP 08/06/2013 Physical Exam  Constitutional: She is oriented to person, place, and  time. She appears well-developed and well-nourished.  HENT:  Head: Normocephalic and atraumatic.  Eyes: Pupils are equal, round, and reactive to light.  chalazion right upper eyelid with slight erythema and swelling around the area. No pain with EOM.   Neck: Normal range of motion. Neck supple.  Cardiovascular: Regular rhythm and normal heart sounds.  Tachycardia present.   Pulmonary/Chest: Effort normal and breath sounds normal. No respiratory distress. She has no wheezes. She has no rales. She exhibits no tenderness.  Abdominal: Soft. Bowel sounds are normal. There is no tenderness. There is no rebound and no guarding.  Musculoskeletal: Normal range of motion. She exhibits no edema.  Lymphadenopathy:    She has no cervical adenopathy.  Neurological: She is alert and oriented to person, place, and time.  Skin: Skin is warm and dry. No rash noted.  Psychiatric: She has a normal mood and affect.    ED Course  Procedures (including critical care time) Medications  sodium chloride 0.9 % bolus 1,000 mL (0 mLs Intravenous Stopped 08/11/13 2021)    DIAGNOSTIC STUDIES: Oxygen Saturation is 100% on RA, normal by my interpretation.    COORDINATION OF CARE: 6:47 PM- Discussed treatment plan with pt. Pt agrees to plan.    Labs Review Labs Reviewed - No data to display  Imaging Review No results found.   EKG Interpretation   Date/Time:  Sunday August 11 2013 18:57:01 EDT Ventricular Rate:  103 PR Interval:  166 QRS Duration: 94 QT Interval:  360 QTC Calculation: 471 R Axis:   44 Text Interpretation:  Sinus tachycardia Possible Left atrial enlargement  Cannot rule out Anterior infarct , age undetermined Abnormal ECG No old  tracing to compare Confirmed by Jannie Doyle  MD, Keerstin Bjelland ([id number]) on 08/11/2013  8:28:55 PM      MDM   Final diagnoses:  Chalazion of right upper eyelid  Medication overdose    Pt with chalazion to eye.  Advised hot compresses.  She has an optometrist to f/u  with if her symptoms are not improving in 2 days.  She is also having fracture of the excessive Benadryl ingestion. Poison control was contacted and advised monitoring for a couple hours. She had an EKG which shows a sinus rhythm. There is no arrhythmias noted. She was given IV fluids. Her symptoms improved with observation. Her heart rate came down into the 90s. Her blood pressure was elevated but she has a history of hypertension. She'll need a followup with her primary care physician for recheck.  I personally performed the services described in this documentation, which was scribed in my presence.  The recorded information has been reviewed and considered.   Rolan BuccoMelanie Anamaria Dusenbury, MD 08/11/13 2040

## 2013-08-11 NOTE — ED Notes (Signed)
[name]  She related an EKG would be helpful, along with NS bolus.  Dose that pt took is approximately 450 mg of benadryl which is just above dosage for adult over 24 hours.  30 mg of Zyrtec is not worrisome.  [name] will call back later regarding follow up.

## 2013-08-11 NOTE — ED Notes (Signed)
Swelling, irritation to right eye.  No known injury.  Has been taking children's allergy medication for the symptoms.  Drank a bottle and a half of children's Benadryl since yesterday (equivalent to 442.5 mg) and a half a bottle of children's cetirizine (approx 30 mg) since yesterday, and a loratadine 10 mg tablet.  Is reporting numbness/tingling to her hands and feeling like her throat is swelling.  Also used generic Flonase.

## 2013-08-15 ENCOUNTER — Telehealth: Payer: Self-pay | Admitting: Family

## 2013-08-15 NOTE — Telephone Encounter (Signed)
OK to send 30 tabs, but please let pt know that she is past due for follow up and will need ov prior to additional refills.

## 2013-08-15 NOTE — Telephone Encounter (Signed)
Refill- zolpidem °

## 2013-08-15 NOTE — Telephone Encounter (Signed)
Please advise refill? Last RX was done on 02-18-13 quantity 30 with 0 refills  Last OV was 02-18-13

## 2013-08-16 MED ORDER — ZOLPIDEM TARTRATE ER 12.5 MG PO TBCR: 12.5000 mg | EXTENDED_RELEASE_TABLET | Freq: Every evening | ORAL | Status: DC | PRN

## 2013-08-16 NOTE — Telephone Encounter (Signed)
Pt last seen in 02/2013 for thyroid, BP and anxiety and advised to follow up in 3 months. Pt is past due for follow up. Please call pt to arrange appt. 30 day supply zolpidem faxed to pharmacy.

## 2013-08-16 NOTE — Telephone Encounter (Signed)
Left message for patient to return my call.

## 2013-08-20 NOTE — Telephone Encounter (Signed)
Left message for patient to return my call.

## 2013-08-22 NOTE — Telephone Encounter (Signed)
Left message for patient to return my call.

## 2013-08-23 NOTE — Telephone Encounter (Signed)
Letter mailed to pt.  

## 2015-03-19 LAB — HM PAP SMEAR: HM PAP: NORMAL

## 2017-01-16 LAB — HM MAMMOGRAPHY: HM Mammogram: NORMAL (ref 0–4)

## 2017-03-31 ENCOUNTER — Ambulatory Visit: Payer: Self-pay | Admitting: Medical

## 2017-05-05 ENCOUNTER — Encounter: Payer: Self-pay | Admitting: Family

## 2017-06-02 ENCOUNTER — Ambulatory Visit (INDEPENDENT_AMBULATORY_CARE_PROVIDER_SITE_OTHER): Payer: Managed Care, Other (non HMO) | Admitting: Family

## 2017-06-02 ENCOUNTER — Encounter: Payer: Self-pay | Admitting: Family

## 2017-06-02 VITALS — BP 127/86 | HR 73 | Temp 98.2°F | Resp 16 | Ht 63.0 in | Wt 163.0 lb

## 2017-06-02 DIAGNOSIS — F418 Other specified anxiety disorders: Secondary | ICD-10-CM

## 2017-06-02 DIAGNOSIS — G43909 Migraine, unspecified, not intractable, without status migrainosus: Secondary | ICD-10-CM

## 2017-06-02 DIAGNOSIS — I10 Essential (primary) hypertension: Secondary | ICD-10-CM

## 2017-06-02 DIAGNOSIS — E559 Vitamin D deficiency, unspecified: Secondary | ICD-10-CM

## 2017-06-02 DIAGNOSIS — L659 Nonscarring hair loss, unspecified: Secondary | ICD-10-CM | POA: Diagnosis not present

## 2017-06-02 DIAGNOSIS — G4700 Insomnia, unspecified: Secondary | ICD-10-CM | POA: Diagnosis not present

## 2017-06-02 MED ORDER — ESCITALOPRAM OXALATE 10 MG PO TABS: ORAL_TABLET | ORAL | 0 refills | Status: DC

## 2017-06-02 MED ORDER — BUPROPION HCL ER (XL) 150 MG PO TB24: 150.0000 mg | ORAL_TABLET | Freq: Every day | ORAL | 1 refills | Status: DC

## 2017-06-02 MED ORDER — METOPROLOL SUCCINATE ER 100 MG PO TB24: 100.0000 mg | ORAL_TABLET | Freq: Every day | ORAL | 5 refills | Status: DC

## 2017-06-02 MED ORDER — ZOLPIDEM TARTRATE ER 12.5 MG PO TBCR: 12.5000 mg | EXTENDED_RELEASE_TABLET | Freq: Every evening | ORAL | 0 refills | Status: DC | PRN

## 2017-06-02 NOTE — Progress Notes (Signed)
Subjective:    Patient ID: Lindsey Burton, female    DOB: [birth year], 42 y.o.   MRN: [id number]  HPI  Ms. Pisani is a 42 yr old female who presents today to re-establish care. We last saw her back in 2019.  Pmhx is significant for:  1) HTN- She is maintaind on toprol xl 100mg .   BP Readings from Last 3 Encounters:  06/02/17 127/86  08/11/13 166/98  02/18/13 130/60   2) Depression- she is currently maintained on wellbutrin xl 300mg . Reports that she has been taking 1/2 tablet most days.   Reports "a lot of anxiety and a lot of stress."  Reports that symptoms worsened when her sister moved here from [place] and she was helping with her care.  Husband was gone for 2 years in [facility] working.  She reports that her mom was put in behavioral health.  Mom is now doing better. Sister passed away 1 year ago.  Feels that sometimes her depression is worse than others. Reports ongoing anxiety symptoms. Worries about how her daughter will be adjusting to adulthood (she is 18 and has autism).     3) Migraines- maintained on prn imitrex. Reports that migraines are much better. Used to have 9 a month and now only has 3 a month, generally around the time of her period.   4) Alopecia- receiving triamcinolon injections by dermatology Malva Cogan). [facility] dermatology.    5) Insomnia- maintained on ambien. Reports that she did not have success on lower doses.  Does not take on the weekends.    6) Vit D deficiency- has been on weekly x 1.5-2 years.  Reports that she has had her thyroid checked yearly and it has been normal Review of Systems    see HPI  Past Medical History:  Diagnosis Date  . Alopecia    Getting kenalog injections prn  . Anxiety   . Depression   . Hypertension   . Insomnia   . Migraines   . Migraines   . Vitamin D deficiency      Social History   Socioeconomic History  . Marital status: Married    Spouse name: Not on file  . Number of children: Not on file    . Years of education: Not on file  . Highest education level: Not on file  Social Needs  . Financial resource strain: Not on file  . Food insecurity - worry: Not on file  . Food insecurity - inability: Not on file  . Transportation needs - medical: Not on file  . Transportation needs - non-medical: Not on file  Occupational History  . Not on file  Tobacco Use  . Smoking status: Never Smoker  . Smokeless tobacco: Never Used  Substance and Sexual Activity  . Alcohol use: No  . Drug use: No  . Sexual activity: Not on file  Other Topics Concern  . Not on file  Social History Narrative   Daughter, age 64 autistic   Works at [employer]   Enjoys TV   No pets    No past surgical history on file.  Family History  Problem Relation Age of Onset  . Hypertension Father   . Diabetes Father   . Hyperlipidemia Father   . Heart attack Father   . Diabetes Sister   . Hypertension Sister   . Cancer Neg Hx     Allergies  Allergen Reactions  . Ace Inhibitors Swelling    angioedema   .  Lisinopril Swelling    Current Outpatient Medications on File Prior to Visit  Medication Sig Dispense Refill  . buPROPion (WELLBUTRIN XL) 300 MG 24 hr tablet Take 1 tablet by mouth daily.  0  . fluticasone (FLONASE) 50 MCG/ACT nasal spray Place 2 sprays into the nose daily. 16 g 5  . metoprolol succinate (TOPROL-XL) 100 MG 24 hr tablet Take 1 tablet (100 mg total) by mouth daily. 30 tablet 5  . SUMAtriptan (IMITREX) 100 MG tablet One tab at start of headache, may repeat 2 hours later as needed. Max 2 dose per 24 hrs 10 tablet 5  . Triamcinolone Acetonide (KENALOG IJ) Inject 60 mg as directed. As needed or Every 1 1/2 months for Alopecia    . Vitamin D, Ergocalciferol, (DRISDOL) 50000 units CAPS capsule Take 1 capsule by mouth once a week.    . zolpidem (AMBIEN CR) 12.5 MG CR tablet Take 1 tablet (12.5 mg total) by mouth at bedtime as needed. 30 tablet 0   No current facility-administered  medications on file prior to visit.     BP 127/86 (BP Location: Right Arm, Cuff Size: Large)   Pulse 73   Temp 98.2 F (36.8 C) (Oral)   Resp 16   Ht 5\' 3"  (1.6 m)   Wt 163 lb (73.9 kg)   LMP 05/12/2017   SpO2 100%   BMI 28.87 kg/m    Objective:   Physical Exam  Constitutional: She is oriented to person, place, and time. She appears well-developed and well-nourished.  HENT:  Head: Normocephalic and atraumatic.  exophthalmos noted   Eyes: Conjunctivae are normal.  Neck: No thyromegaly present.  Cardiovascular: Normal rate, regular rhythm and normal heart sounds.  No murmur heard. Pulmonary/Chest: Effort normal and breath sounds normal. No respiratory distress. She has no wheezes.  Lymphadenopathy:    She has no cervical adenopathy.  Neurological: She is alert and oriented to person, place, and time.  Skin: Skin is warm and dry.  Psychiatric: She has a normal mood and affect. Her behavior is normal. Judgment and thought content normal.          Assessment & Plan:  History of hyperthyroid-clinically stable.  We will plan to check follow-up TSH at her physical next month.  She reports that she has had normal annual TSHs.  Depression/anxiety-uncontrolled.  She is cutting her 300 mg extended release Wellbutrin in half.  She is only taking a half tab once daily.  I have advised her not to cut the extended release tabs.  We will change her to 150 mg extended release once daily.  Will add Lexapro 10 mg for more help with her anxiety and depression. I instructed pt to start 1/2 tablet once daily for 1 week and then increase to a full tablet once daily on week two as tolerated.  We discussed common side effects such as nausea, drowsiness and weight gain.  Also discussed rare but serious side effect of suicide ideation.  She is instructed to discontinue medication go directly to ED if this occurs.  Pt verbalizes understanding.  Plan follow up in 1 month to evaluate progress.     Migraines- migraines are much improved.  Perhaps due to addition of beta-blocker.  Continue as needed Imitrex as well as beta-blocker.  Alopecia-this is stable and is being managed by dermatology.  Hypertension-blood pressure is stable on current medication.  Continue same.  Vitamin D deficiency-plan to check vitamin D at her upcoming physical.  Insomnia-she is maintained  on zolpidem ER 12.5 mg once daily.  I have reviewed the controlled substance registry.  Last fill was on 05/18/2017.  A controlled substance contract is signed today and a urine drug screen will be obtained.   A total of 35  minutes were spent face-to-face with the patient during this encounter and over half of that time was spent on counseling and coordination of care. The patient was counseled on HTN, insomnia, controlled substance contract, depression/anxiety and its treatment.

## 2017-06-02 NOTE — Patient Instructions (Addendum)
Please complete lab work prior to leaving. Begin lexapro 1/2 tab once daily for 1 week, then increase to a full tab once daily on week two.

## 2017-06-03 LAB — PAIN MGMT, PROFILE 8 W/CONF, U
6 ACETYLMORPHINE: NEGATIVE ng/mL (ref <10)
AMPHETAMINES: NEGATIVE ng/mL (ref <500)
Alcohol Metabolites: NEGATIVE ng/mL (ref <500)
Benzodiazepines: NEGATIVE ng/mL (ref <100)
Buprenorphine, Urine: NEGATIVE ng/mL (ref <5)
COCAINE METABOLITE: NEGATIVE ng/mL (ref <150)
Creatinine: 170 mg/dL
MARIJUANA METABOLITE: NEGATIVE ng/mL (ref <20)
MDMA: NEGATIVE ng/mL (ref <500)
OPIATES: NEGATIVE ng/mL (ref <100)
OXIDANT: NEGATIVE ug/mL (ref <200)
OXYCODONE: NEGATIVE ng/mL (ref <100)
pH: 6.47 (ref 4.5–9.0)

## 2017-07-07 ENCOUNTER — Ambulatory Visit (INDEPENDENT_AMBULATORY_CARE_PROVIDER_SITE_OTHER): Payer: Managed Care, Other (non HMO) | Admitting: Family

## 2017-07-07 ENCOUNTER — Encounter: Payer: Self-pay | Admitting: Family

## 2017-07-07 VITALS — BP 143/88 | Temp 98.6°F | Resp 16 | Ht 63.0 in | Wt 163.8 lb

## 2017-07-07 DIAGNOSIS — F418 Other specified anxiety disorders: Secondary | ICD-10-CM | POA: Diagnosis not present

## 2017-07-07 DIAGNOSIS — Z0000 Encounter for general adult medical examination without abnormal findings: Secondary | ICD-10-CM

## 2017-07-07 DIAGNOSIS — Z0001 Encounter for general adult medical examination with abnormal findings: Secondary | ICD-10-CM

## 2017-07-07 DIAGNOSIS — J0100 Acute maxillary sinusitis, unspecified: Secondary | ICD-10-CM

## 2017-07-07 LAB — CBC WITH DIFFERENTIAL/PLATELET
BASOS ABS: 23 {cells}/uL (ref 0–200)
BASOS PCT: 0.3 %
EOS ABS: 101 {cells}/uL (ref 15–500)
EOS PCT: 1.3 %
HEMATOCRIT: 38.3 % (ref 35.0–45.0)
HEMOGLOBIN: 12.9 g/dL (ref 11.7–15.5)
LYMPHS ABS: 1973 {cells}/uL (ref 850–3900)
MCH: 29.5 pg (ref 27.0–33.0)
MCHC: 33.7 g/dL (ref 32.0–36.0)
MCV: 87.6 fL (ref 80.0–100.0)
MPV: 11.2 fL (ref 7.5–12.5)
Monocytes Relative: 6.7 %
NEUTROS ABS: 5179 {cells}/uL (ref 1500–7800)
Neutrophils Relative %: 66.4 %
Platelets: 239 10*3/uL (ref 140–400)
RBC: 4.37 10*6/uL (ref 3.80–5.10)
RDW: 12.6 % (ref 11.0–15.0)
Total Lymphocyte: 25.3 %
WBC mixed population: 523 cells/uL (ref 200–950)
WBC: 7.8 10*3/uL (ref 3.8–10.8)

## 2017-07-07 LAB — BASIC METABOLIC PANEL
BUN: 12 mg/dL (ref 7–25)
CALCIUM: 9.6 mg/dL (ref 8.6–10.2)
CO2: 26 mmol/L (ref 20–32)
Chloride: 104 mmol/L (ref 98–110)
Creat: 0.81 mg/dL (ref 0.50–1.10)
GLUCOSE: 94 mg/dL (ref 65–99)
Potassium: 5 mmol/L (ref 3.5–5.3)
SODIUM: 142 mmol/L (ref 135–146)

## 2017-07-07 LAB — URINALYSIS, ROUTINE W REFLEX MICROSCOPIC
BILIRUBIN URINE: NEGATIVE
GLUCOSE, UA: NEGATIVE
HGB URINE DIPSTICK: NEGATIVE
KETONES UR: NEGATIVE
LEUKOCYTES UA: NEGATIVE
Nitrite: NEGATIVE
PH: 6 (ref 5.0–8.0)
PROTEIN: NEGATIVE
Specific Gravity, Urine: 1.022 (ref 1.001–1.03)

## 2017-07-07 LAB — HEPATIC FUNCTION PANEL
AG RATIO: 1.2 (calc) (ref 1.0–2.5)
ALKALINE PHOSPHATASE (APISO): 82 U/L (ref 33–115)
ALT: 9 U/L (ref 6–29)
AST: 15 U/L (ref 10–30)
Albumin: 4.1 g/dL (ref 3.6–5.1)
BILIRUBIN TOTAL: 0.3 mg/dL (ref 0.2–1.2)
Bilirubin, Direct: 0.1 mg/dL (ref 0.0–0.2)
GLOBULIN: 3.4 g/dL (ref 1.9–3.7)
Indirect Bilirubin: 0.2 mg/dL (calc) (ref 0.2–1.2)
TOTAL PROTEIN: 7.5 g/dL (ref 6.1–8.1)

## 2017-07-07 LAB — LIPID PANEL
Cholesterol: 223 mg/dL — ABNORMAL HIGH (ref <200)
HDL: 75 mg/dL (ref >50)
LDL CHOLESTEROL (CALC): 124 mg/dL — AB
NON-HDL CHOLESTEROL (CALC): 148 mg/dL — AB (ref <130)
TRIGLYCERIDES: 127 mg/dL (ref <150)
Total CHOL/HDL Ratio: 3 (calc) (ref <5.0)

## 2017-07-07 LAB — TSH: TSH: 1.21 m[IU]/L

## 2017-07-07 MED ORDER — AMOXICILLIN-POT CLAVULANATE 875-125 MG PO TABS: 1.0000 | ORAL_TABLET | Freq: Two times a day (BID) | ORAL | 0 refills | Status: AC

## 2017-07-07 MED ORDER — NORGESTIM-ETH ESTRAD TRIPHASIC 0.18/0.215/0.25 MG-35 MCG PO TABS: 1.0000 | ORAL_TABLET | Freq: Every day | ORAL | 4 refills | Status: DC

## 2017-07-07 MED ORDER — ESCITALOPRAM OXALATE 10 MG PO TABS: 10.0000 mg | ORAL_TABLET | Freq: Every day | ORAL | 0 refills | Status: DC

## 2017-07-07 NOTE — Patient Instructions (Signed)
Please complete lab work prior to leaving.  Continue current dose of lexapro. Start augmentin for your sinus infection- call if symptoms worsen or if they are not improved in 2-3 days.

## 2017-07-07 NOTE — Progress Notes (Signed)
Subjective:    Patient ID: Lindsey Burton, female    DOB: [birth year], 42 y.o.   MRN: [id number]  HPI  Patient presents today for complete physical.  Immunizations: She believes that she had a tetanus 2 years ago- she will confirm with her previous provider office.  Diet: reports diet is improving Wt Readings from Last 3 Encounters:  07/07/17 163 lb 12.8 oz (74.3 kg)  06/02/17 163 lb (73.9 kg)  08/11/13 164 lb (74.4 kg)  Exercise:  Not exercising.  Pap Smear: due 03/18/18  Mammogram: 02/02/18 Vision: has an appointment next week Dental: up to date  Began with itchy throat, last Friday.  Reports that she the developed pain in her eye sockets and tooth pain, had some augmentin tabs that were old. This helped.   Depression/anxiety- reports that her anxiety is much better.  Mood is much better.  Reports that she was sleepy at first after starting lexapro, but this improved.     Review of Systems  Constitutional: Negative for unexpected weight change.  HENT: Positive for sinus pressure and sneezing.   Respiratory: Negative for cough.   Cardiovascular: Negative for leg swelling.  Gastrointestinal: Negative for constipation and diarrhea.  Genitourinary: Negative for dysuria, frequency and menstrual problem.  Musculoskeletal: Negative for arthralgias and myalgias.  Skin:       Some rashes intermittent which she attributes to anxiety, none today  Neurological: Negative for headaches.  Hematological: Negative for adenopathy.  Psychiatric/Behavioral:       See HPI   Past Medical History:  Diagnosis Date  . Alopecia    Getting kenalog injections prn  . Anxiety   . Depression   . Hypertension   . Insomnia   . Migraines   . Migraines   . Vitamin D deficiency      Social History   Socioeconomic History  . Marital status: Married    Spouse name: Not on file  . Number of children: Not on file  . Years of education: Not on file  . Highest education level: Not on file    Occupational History  . Not on file  Social Needs  . Financial resource strain: Not on file  . Food insecurity:    Worry: Not on file    Inability: Not on file  . Transportation needs:    Medical: Not on file    Non-medical: Not on file  Tobacco Use  . Smoking status: Never Smoker  . Smokeless tobacco: Never Used  Substance and Sexual Activity  . Alcohol use: No  . Drug use: No  . Sexual activity: Not on file  Lifestyle  . Physical activity:    Days per week: Not on file    Minutes per session: Not on file  . Stress: Not on file  Relationships  . Social connections:    Talks on phone: Not on file    Gets together: Not on file    Attends religious service: Not on file    Active member of club or organization: Not on file    Attends meetings of clubs or organizations: Not on file    Relationship status: Not on file  . Intimate partner violence:    Fear of current or ex partner: Not on file    Emotionally abused: Not on file    Physically abused: Not on file    Forced sexual activity: Not on file  Other Topics Concern  . 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  Family History  Problem Relation Age of Onset  . Hypertension Father   . Diabetes Father   . Hyperlipidemia Father   . Heart attack Father   . Diabetes Sister   . Hypertension Sister   . Cancer Neg Hx     Allergies  Allergen Reactions  . Ace Inhibitors Swelling    angioedema   . Lisinopril Swelling    Current Outpatient Medications on File Prior to Visit  Medication Sig Dispense Refill  . buPROPion (WELLBUTRIN XL) 150 MG 24 hr tablet Take 1 tablet (150 mg total) by mouth daily. 30 tablet 1  . fluticasone (FLONASE) 50 MCG/ACT nasal spray Place 2 sprays into the  nose daily. 16 g 5  . metoprolol succinate (TOPROL-XL) 100 MG 24 hr tablet Take 1 tablet (100 mg total) by mouth daily. 30 tablet 5  . SUMAtriptan (IMITREX) 100 MG tablet One tab at start of headache, may repeat 2 hours later as needed. Max 2 dose per 24 hrs 10 tablet 5  . Triamcinolone Acetonide (KENALOG IJ) Inject 60 mg as directed. As needed or Every 1 1/2 months for Alopecia    . Vitamin D, Ergocalciferol, (DRISDOL) 50000 units CAPS capsule Take 1 capsule by mouth once a week.    . zolpidem (AMBIEN CR) 12.5 MG CR tablet Take 1 tablet (12.5 mg total) by mouth at bedtime as needed. 30 tablet 0   No current facility-administered medications on file prior to visit.     BP (!) 143/88 (BP Location: Right Arm, Patient Position: Sitting, Cuff Size: Small)   Temp 98.6 F (37 C) (Oral)   Resp 16   Ht 5\' 3"  (1.6 m)   Wt 163 lb 12.8 oz (74.3 kg)   LMP 06/13/2017   SpO2 100%   BMI 29.02 kg/m       Objective:   Physical Exam  Physical Exam  Constitutional: She is oriented to person, place, and time. She appears well-developed and well-nourished. No distress.  HENT:  Head: Normocephalic and atraumatic.  Right Ear: Tympanic membrane and ear canal normal.  Left Ear: Tympanic membrane and ear canal normal.  Mouth/Throat: Oropharynx is clear and moist.  Eyes: Exam limited by colored contacts. + exopthalmos No scleral icterus.  Neck: Normal range of motion. No thyromegaly present.  Cardiovascular: Normal rate and regular rhythm.   No murmur heard. Pulmonary/Chest: Effort normal and breath sounds normal. No respiratory distress. He has no wheezes. She has no rales. She exhibits no tenderness.  Abdominal: Soft. Bowel sounds are normal. She exhibits no distension and no mass. There is no tenderness. There is no rebound and no guarding.  Musculoskeletal: She exhibits no edema.  Lymphadenopathy:    She has no cervical adenopathy.  Neurological: She is alert and oriented to person, place, and  time. She has normal patellar reflexes. She exhibits normal muscle tone. Coordination normal.  Skin: Skin is warm and dry.  Psychiatric: She has a normal mood and affect. Her behavior is normal. Judgment and thought content normal.  Breasts: Examined lying Right: Without masses, retractions, discharge or axillary adenopathy.  Left: Without masses, retractions, discharge or axillary adenopathy.  Pelvic: deferred  Assessment & Plan:         Assessment & Plan:  EKG tracing is personally reviewed.  EKG notes NSR- mild tachycardia.  No acute changes.   Depression/anxiety- improved. Continue current dose of lexapro.  Sinusitis- rx with augmentin.  Preventative care-discussed healthy diet and exercise.  She believes her tetanus is up-to-date and will obtain records.  She will be due for a Pap smear in December.  We will obtain routine lab work.

## 2017-07-07 NOTE — Progress Notes (Signed)
ek 

## 2017-07-09 ENCOUNTER — Encounter: Payer: Self-pay | Admitting: Family

## 2017-07-12 ENCOUNTER — Encounter: Payer: Managed Care, Other (non HMO) | Admitting: Family

## 2017-07-23 ENCOUNTER — Encounter: Payer: Self-pay | Admitting: Family

## 2017-07-31 ENCOUNTER — Other Ambulatory Visit: Payer: Self-pay | Admitting: Family

## 2017-09-16 HISTORY — PX: LIPOSUCTION: SHX10

## 2017-09-19 ENCOUNTER — Other Ambulatory Visit: Payer: Self-pay | Admitting: Family

## 2017-09-29 ENCOUNTER — Ambulatory Visit (INDEPENDENT_AMBULATORY_CARE_PROVIDER_SITE_OTHER): Payer: Managed Care, Other (non HMO) | Admitting: Family

## 2017-09-29 ENCOUNTER — Encounter: Payer: Self-pay | Admitting: Family

## 2017-09-29 ENCOUNTER — Ambulatory Visit: Payer: Managed Care, Other (non HMO) | Admitting: Family

## 2017-09-29 ENCOUNTER — Ambulatory Visit (HOSPITAL_BASED_OUTPATIENT_CLINIC_OR_DEPARTMENT_OTHER)
Admission: RE | Admit: 2017-09-29 | Discharge: 2017-09-29 | Disposition: A | Discharge status: Home / Self Care | Payer: Managed Care, Other (non HMO) | Source: Ambulatory Visit | Attending: Family | Admitting: Family

## 2017-09-29 VITALS — BP 120/80 | HR 74 | Temp 98.0°F | Resp 18 | Wt 168.2 lb

## 2017-09-29 DIAGNOSIS — F418 Other specified anxiety disorders: Secondary | ICD-10-CM | POA: Diagnosis not present

## 2017-09-29 DIAGNOSIS — I10 Essential (primary) hypertension: Secondary | ICD-10-CM

## 2017-09-29 DIAGNOSIS — G43909 Migraine, unspecified, not intractable, without status migrainosus: Secondary | ICD-10-CM | POA: Diagnosis not present

## 2017-09-29 DIAGNOSIS — Z01818 Encounter for other preprocedural examination: Secondary | ICD-10-CM | POA: Diagnosis not present

## 2017-09-29 DIAGNOSIS — G4700 Insomnia, unspecified: Secondary | ICD-10-CM | POA: Diagnosis not present

## 2017-09-29 LAB — CBC WITH DIFFERENTIAL/PLATELET
BASOS ABS: 0 10*3/uL (ref 0.0–0.1)
Basophils Relative: 0.3 % (ref 0.0–3.0)
Eosinophils Absolute: 0.1 10*3/uL (ref 0.0–0.7)
Eosinophils Relative: 1.5 % (ref 0.0–5.0)
HCT: 36.2 % (ref 36.0–46.0)
Hemoglobin: 11.9 g/dL — ABNORMAL LOW (ref 12.0–15.0)
LYMPHS ABS: 2.2 10*3/uL (ref 0.7–4.0)
Lymphocytes Relative: 29.3 % (ref 12.0–46.0)
MCHC: 32.7 g/dL (ref 30.0–36.0)
MCV: 89.4 fl (ref 78.0–100.0)
MONO ABS: 0.5 10*3/uL (ref 0.1–1.0)
Monocytes Relative: 6.4 % (ref 3.0–12.0)
NEUTROS ABS: 4.7 10*3/uL (ref 1.4–7.7)
NEUTROS PCT: 62.5 % (ref 43.0–77.0)
PLATELETS: 190 10*3/uL (ref 150.0–400.0)
RBC: 4.05 Mil/uL (ref 3.87–5.11)
RDW: 13.4 % (ref 11.5–15.5)
WBC: 7.5 10*3/uL (ref 4.0–10.5)

## 2017-09-29 LAB — BASIC METABOLIC PANEL
BUN: 9 mg/dL (ref 6–23)
CALCIUM: 8.9 mg/dL (ref 8.4–10.5)
CO2: 31 meq/L (ref 19–32)
Chloride: 104 mEq/L (ref 96–112)
Creatinine, Ser: 0.7 mg/dL (ref 0.40–1.20)
GFR: 116.95 mL/min (ref >60.00)
GLUCOSE: 123 mg/dL — AB (ref 70–99)
Potassium: 4.5 mEq/L (ref 3.5–5.1)
Sodium: 142 mEq/L (ref 135–145)

## 2017-09-29 LAB — HEPATIC FUNCTION PANEL
ALT: 12 U/L (ref 0–35)
AST: 17 U/L (ref 0–37)
Albumin: 3.8 g/dL (ref 3.5–5.2)
Alkaline Phosphatase: 103 U/L (ref 39–117)
BILIRUBIN TOTAL: 0.5 mg/dL (ref 0.2–1.2)
Bilirubin, Direct: 0.1 mg/dL (ref 0.0–0.3)
Total Protein: 6.5 g/dL (ref 6.0–8.3)

## 2017-09-29 LAB — PROTIME-INR
INR: 1 ratio (ref 0.8–1.0)
PROTHROMBIN TIME: 12.1 s (ref 9.6–13.1)

## 2017-09-29 MED ORDER — BUPROPION HCL ER (XL) 150 MG PO TB24: ORAL_TABLET | ORAL | 2 refills | Status: DC

## 2017-09-29 MED ORDER — ZOLPIDEM TARTRATE ER 12.5 MG PO TBCR: 12.5000 mg | EXTENDED_RELEASE_TABLET | Freq: Every evening | ORAL | 0 refills | Status: DC | PRN

## 2017-09-29 MED FILL — ZOLPIDEM TART ER 12.5 MG TA: 12.5 | 23 days supply | Qty: 15 | Fill #0

## 2017-09-29 NOTE — Patient Instructions (Signed)
Please complete lab work prior to leaving. Complete x-ray on the first floor.  

## 2017-09-29 NOTE — Progress Notes (Addendum)
Subjective:    Patient ID: Lindsey Burton, female    DOB: Oct 11, 1975, 42 y.o.   MRN: [id number]  HPI  Lindsey Burton is a 42 yr old female who presents today for pre-operative clearance. She will be having an upcoming cosmetic procedure under general anesthesia. She will be having some fat removal from her breasts/abdomen/flanks.  She also requests refills on her medications.  Insomnia- maintained on prn ambien. Finds this very helpful.    Anxiety/depression- reports mood is good on lexapro/wellbutrin.   Migraines- denies recent migraines.     Review of Systems    see HPI  Past Medical History:  Diagnosis Date  . Alopecia    Getting kenalog injections prn  . Anxiety   . Depression   . Hypertension   . Insomnia   . Migraines   . Migraines   . Vitamin D deficiency      Social History   Socioeconomic History  . Marital status: Married    Spouse name: Not on file  . Number of children: Not on file  . Years of education: Not on file  . Highest education level: Not on file  Occupational History  . Not on file  Social Needs  . Financial resource strain: Not on file  . Food insecurity:    Worry: Not on file    Inability: Not on file  . Transportation needs:    Medical: Not on file    Non-medical: Not on file  Tobacco Use  . Smoking status: Never Smoker  . Smokeless tobacco: Never Used  Substance and Sexual Activity  . Alcohol use: No  . Drug use: No  . Sexual activity: Not on file  Lifestyle  . Physical activity:    Days per week: Not on file    Minutes per session: Not on file  . Stress: Not on file  Relationships  . Social connections:    Talks on phone: Not on file    Gets together: Not on file    Attends religious service: Not on file    Active member of club or organization: Not on file    Attends meetings of clubs or organizations: Not on file    Relationship status: Not on file  . Intimate partner violence:    Fear of current or ex partner:  Not on file    Emotionally abused: Not on file    Physically abused: Not on file    Forced sexual activity: Not on file  Other Topics Concern  . 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 No pertinent surgical history.  Family History  Problem Relation Age of Onset  . Hypertension Father   . Diabetes Father   . Hyperlipidemia Father   . Heart attack Father   . Diabetes Sister   . Hypertension Sister   . Cancer Neg Hx     Allergies  Allergen Reactions  . Ace Inhibitors Swelling    angioedema   . Lisinopril Swelling    Current Outpatient Medications on File Prior to Visit  Medication Sig Dispense Refill  . escitalopram (LEXAPRO) 10 MG tablet Take 1 tablet by mouth daily. 90 tablet 1  . fluticasone (FLONASE) 50 MCG/ACT nasal  spray Place 2 sprays into the nose daily. 16 g 5  . metoprolol succinate (TOPROL-XL) 100 MG 24 hr tablet Take 1 tablet (100 mg total) by mouth daily. 30 tablet 5  . Norgestimate-Ethinyl Estradiol Triphasic 0.18/0.215/0.25 MG-35 MCG tablet Take 1 tablet by mouth daily. 3 Package 4  . SUMAtriptan (IMITREX) 100 MG tablet One tab at start of headache, may repeat 2 hours later as needed. Max 2 dose per 24 hrs 10 tablet 5  . Triamcinolone Acetonide (KENALOG IJ) Inject 60 mg as directed. As needed or Every 1 1/2 months for Alopecia    . Vitamin D, Ergocalciferol, (DRISDOL) 50000 units CAPS capsule Take 1 capsule by mouth once a week.     No current facility-administered medications on file prior to visit.     BP 120/80 (BP Location: Left Arm, Patient Position: Sitting, Cuff Size: Normal)   Pulse 74   Temp 98 F (36.7 C) (Oral)   Resp 18   Wt 168 lb 3.2 oz (76.3 kg)   SpO2 98%   BMI 29.80 kg/m    Objective:   Physical Exam    Constitutional: She appears well-developed and well-nourished.  Cardiovascular: Normal rate, regular rhythm and normal heart sounds.  No murmur heard. Pulmonary/Chest: Effort normal and breath sounds normal. No respiratory distress. She has no wheezes.  Abdominal: Soft. She exhibits no distension. There is no tenderness.  Skin: Skin is warm and dry.  Psychiatric: She has a normal mood and affect. Her behavior is normal. Judgment and thought content normal.          Assessment & Plan:  Pre-operative evaluation- will check routine lab work. EKG tracing is personally reviewed.  EKG notes NSR.  No acute changes. Obtain CXR.   Insomnia- stable with prn use of ambien. Reviewed controlled substance registry.  Refills appropriate. Refill sent.  Anxiety/depression- stable on current meds. Continue wellbutrin and lexapro.   Migraines- stable no recent migraines.  HTN- BP stable on toprol xl, continue same.   Addendum:  Reviewed lab work/ekg and cxr.  No medical contraindication to upcoming planned surgical procedure.

## 2017-09-30 ENCOUNTER — Telehealth: Payer: Self-pay | Admitting: Family

## 2017-09-30 ENCOUNTER — Other Ambulatory Visit: Payer: Self-pay | Admitting: Family

## 2017-09-30 LAB — PREGNANCY, URINE: Preg Test, Ur: NEGATIVE

## 2017-09-30 MED ORDER — MULTI-VITAMIN/MINERALS PO TABS: 1.0000 | ORAL_TABLET | Freq: Every day | ORAL | Status: AC

## 2017-09-30 NOTE — Telephone Encounter (Signed)
See mychart.  

## 2017-10-02 ENCOUNTER — Telehealth: Payer: Self-pay | Admitting: Family

## 2017-10-02 ENCOUNTER — Telehealth: Payer: Self-pay

## 2017-10-02 NOTE — Telephone Encounter (Signed)
Forwarded OV notes with med/surgical clearance, X-ray, labs, and EKG to Dr. Blair DolphinW. Dale Franks//SLS 06/17

## 2017-10-02 NOTE — Telephone Encounter (Signed)
Author phoned pt. re: recent lab results. Chartered loss adjusterAuthor reviewed with Efraim KaufmannMelissa, NP, who stated that pt. has mild anemia, and blood glucose slightly elevated but otherwise labs look good.  No answer on home or mobile phone, so author left generic VM on home phone with call back number [phone]. PEC OK to divulge above message, and to tell pt. As well that ppwk has been given to Jasmine [place]aron, [place], for her to fax re: surgical clearance. Melissa, NP, would like pt.  to schedule a lab appointment for HbA1c at her earliest convenience. OK for PEC to schedule lab visit for HbA1c.

## 2017-10-02 NOTE — Telephone Encounter (Signed)
Please sent copy of my most recent office visit/surgical clearance, lab work, EKG and chest x-ray to Dr. Maudie MercuryW. Dale Franks, fax # [phone]  (phone [phone]).  Thanks.

## 2017-10-03 NOTE — Telephone Encounter (Signed)
Conversation  Doctor, hospital(Newest Message First)  Me    10/02/17 3:06 PM  Note    Forwarded OV notes with med/surgical clearance, X-ray, labs, and EKG to Dr. Lacretia NicksW. Karn Picklerale Franks; provider's CMA is aware//SLS 06/17      10/02/17 12:27 PM  Sandford Craze'Sullivan, Melissa, NP routed this conversation to Me  Sandford Craze'Sullivan, Melissa, NP    10/02/17 12:26 PM  Note    Please sent copy of my most recent office visit/surgical clearance, lab work, EKG and chest x-ray to Dr. Maudie MercuryW. Dale Franks, fax # [phone]  (phone [phone]).  Thanks.

## 2017-10-09 ENCOUNTER — Ambulatory Visit: Payer: Managed Care, Other (non HMO) | Admitting: Family

## 2017-10-09 MED FILL — HYDROCODON-APAP 5-325: 5-325 | 5 days supply | Qty: 20 | Fill #0

## 2017-10-26 ENCOUNTER — Inpatient Hospital Stay: Payer: Managed Care, Other (non HMO) | Admitting: Family

## 2017-11-01 ENCOUNTER — Telehealth: Payer: Self-pay | Admitting: *Deleted

## 2017-11-01 NOTE — Telephone Encounter (Signed)
Received discharge summary for patient from [facility] for syncope, anemia, orthostatic syncope, hypotension; forwarded to provider/SLS 07/17

## 2017-11-02 ENCOUNTER — Telehealth: Payer: Self-pay | Admitting: Family

## 2017-11-02 NOTE — Telephone Encounter (Signed)
See mychart.  

## 2017-11-16 ENCOUNTER — Telehealth: Payer: Self-pay | Admitting: Family

## 2017-11-16 NOTE — Telephone Encounter (Signed)
Please contact pt re: unread mychart message. 

## 2017-11-17 NOTE — Telephone Encounter (Signed)
Left message for pt to return my call. (see message below) [facility] triage to discuss with pt.  11/02/17 12:44 PM  Mauro KaufmannKawana,   I received a note that [facility]. How are you feeling? Why don't you schedule a follow up so we can check on you in the office please. I am out of the office next week but you are welcome to schedule with another provider or see me when I return the following week.   Sandford CrazeMelissa O'Sullivan NP    This MyChart message has not been read.

## 2017-11-20 NOTE — Telephone Encounter (Signed)
Spoke with pt. She states she was going to wait until her follow up with her surgeon which should be in a couple of weeks. Thinks no longer needs f/u with PCP at this time as symptoms have resolved and the ER decided that "her blood was rushing from her head to her feet too fast when she would stand and once that improved she was able to leave the ER". She states she has had no further symptoms or episodes similar to what took her to the ER and declines appt at this time. She does want PCP to know that she has already lost 15 pounds since her surgery.

## 2017-11-20 NOTE — Telephone Encounter (Signed)
Noted  

## 2017-12-02 ENCOUNTER — Other Ambulatory Visit: Payer: Self-pay | Admitting: Family

## 2017-12-29 MED FILL — ZOLPIDEM TART ER 12.5 MG TA: 12.5 | 23 days supply | Qty: 15 | Fill #1

## 2018-03-22 ENCOUNTER — Telehealth: Payer: Self-pay | Admitting: *Deleted

## 2018-03-22 MED ORDER — ESCITALOPRAM OXALATE 10 MG PO TABS: ORAL_TABLET | ORAL | 0 refills | Status: DC

## 2018-03-22 NOTE — Telephone Encounter (Signed)
Received request for lexapro from [facility]. Last OV 09/29/17, next OV due 03/31/18. Refill sent to pharmacy and mychart message sent to pt to schedule OV soon.

## 2018-06-16 ENCOUNTER — Other Ambulatory Visit: Payer: Self-pay | Admitting: Family

## 2018-08-21 ENCOUNTER — Other Ambulatory Visit: Payer: Self-pay | Admitting: Family

## 2018-11-09 ENCOUNTER — Other Ambulatory Visit: Payer: Self-pay | Admitting: Family

## 2018-12-17 ENCOUNTER — Other Ambulatory Visit: Payer: Self-pay

## 2018-12-18 ENCOUNTER — Telehealth: Payer: Self-pay | Admitting: Family

## 2018-12-18 ENCOUNTER — Other Ambulatory Visit (HOSPITAL_COMMUNITY)
Admission: RE | Admit: 2018-12-18 | Discharge: 2018-12-18 | Disposition: A | Discharge status: Home / Self Care | Payer: BC Managed Care – PPO | Source: Ambulatory Visit | Attending: Family | Admitting: Family

## 2018-12-18 ENCOUNTER — Telehealth: Payer: Self-pay

## 2018-12-18 ENCOUNTER — Encounter: Payer: Self-pay | Admitting: Family

## 2018-12-18 ENCOUNTER — Ambulatory Visit (INDEPENDENT_AMBULATORY_CARE_PROVIDER_SITE_OTHER): Payer: BC Managed Care – PPO | Admitting: Family

## 2018-12-18 VITALS — BP 135/83 | HR 75 | Temp 97.8°F | Resp 16 | Ht 63.0 in | Wt 161.8 lb

## 2018-12-18 DIAGNOSIS — E559 Vitamin D deficiency, unspecified: Secondary | ICD-10-CM

## 2018-12-18 DIAGNOSIS — Z01419 Encounter for gynecological examination (general) (routine) without abnormal findings: Secondary | ICD-10-CM | POA: Diagnosis not present

## 2018-12-18 DIAGNOSIS — G43909 Migraine, unspecified, not intractable, without status migrainosus: Secondary | ICD-10-CM

## 2018-12-18 DIAGNOSIS — G4700 Insomnia, unspecified: Secondary | ICD-10-CM

## 2018-12-18 DIAGNOSIS — I10 Essential (primary) hypertension: Secondary | ICD-10-CM

## 2018-12-18 DIAGNOSIS — Z0000 Encounter for general adult medical examination without abnormal findings: Secondary | ICD-10-CM

## 2018-12-18 DIAGNOSIS — F329 Major depressive disorder, single episode, unspecified: Secondary | ICD-10-CM

## 2018-12-18 DIAGNOSIS — F419 Anxiety disorder, unspecified: Secondary | ICD-10-CM

## 2018-12-18 LAB — CBC WITH DIFFERENTIAL/PLATELET
Basophils Absolute: 0 10*3/uL (ref 0.0–0.1)
Basophils Relative: 0.5 % (ref 0.0–3.0)
Eosinophils Absolute: 0.2 10*3/uL (ref 0.0–0.7)
Eosinophils Relative: 2.9 % (ref 0.0–5.0)
HCT: 38 % (ref 36.0–46.0)
Hemoglobin: 12.5 g/dL (ref 12.0–15.0)
Lymphocytes Relative: 41.7 % (ref 12.0–46.0)
Lymphs Abs: 2.9 10*3/uL (ref 0.7–4.0)
MCHC: 32.9 g/dL (ref 30.0–36.0)
MCV: 89.1 fl (ref 78.0–100.0)
Monocytes Absolute: 0.5 10*3/uL (ref 0.1–1.0)
Monocytes Relative: 6.7 % (ref 3.0–12.0)
Neutro Abs: 3.4 10*3/uL (ref 1.4–7.7)
Neutrophils Relative %: 48.2 % (ref 43.0–77.0)
Platelets: 225 10*3/uL (ref 150.0–400.0)
RBC: 4.27 Mil/uL (ref 3.87–5.11)
RDW: 13.2 % (ref 11.5–15.5)
WBC: 7 10*3/uL (ref 4.0–10.5)

## 2018-12-18 LAB — BASIC METABOLIC PANEL
BUN: 11 mg/dL (ref 6–23)
CO2: 28 mEq/L (ref 19–32)
Calcium: 9.4 mg/dL (ref 8.4–10.5)
Chloride: 103 mEq/L (ref 96–112)
Creatinine, Ser: 0.66 mg/dL (ref 0.40–1.20)
GFR: 117.11 mL/min (ref >60.00)
Glucose, Bld: 87 mg/dL (ref 70–99)
Potassium: 4.3 mEq/L (ref 3.5–5.1)
Sodium: 139 mEq/L (ref 135–145)

## 2018-12-18 LAB — HEPATIC FUNCTION PANEL
ALT: 16 U/L (ref 0–35)
AST: 25 U/L (ref 0–37)
Albumin: 4.3 g/dL (ref 3.5–5.2)
Alkaline Phosphatase: 96 U/L (ref 39–117)
Bilirubin, Direct: 0.1 mg/dL (ref 0.0–0.3)
Total Bilirubin: 0.6 mg/dL (ref 0.2–1.2)
Total Protein: 7.4 g/dL (ref 6.0–8.3)

## 2018-12-18 LAB — LIPID PANEL
Cholesterol: 212 mg/dL — ABNORMAL HIGH (ref 0–200)
HDL: 46.6 mg/dL (ref >39.00)
LDL Cholesterol: 139 mg/dL — ABNORMAL HIGH (ref 0–99)
NonHDL: 165.4
Total CHOL/HDL Ratio: 5
Triglycerides: 133 mg/dL (ref 0.0–149.0)
VLDL: 26.6 mg/dL (ref 0.0–40.0)

## 2018-12-18 LAB — TSH: TSH: 0.56 u[IU]/mL (ref 0.35–4.50)

## 2018-12-18 MED ORDER — ESCITALOPRAM OXALATE 10 MG PO TABS: 15.0000 mg | ORAL_TABLET | Freq: Every day | ORAL | 0 refills | Status: DC

## 2018-12-18 MED ORDER — ESCITALOPRAM OXALATE 5 MG PO TABS: 15.0000 mg | ORAL_TABLET | Freq: Every day | ORAL | 1 refills | Status: DC

## 2018-12-18 MED ORDER — ZOLPIDEM TARTRATE ER 12.5 MG PO TBCR: 12.5000 mg | EXTENDED_RELEASE_TABLET | Freq: Every evening | ORAL | 0 refills | Status: DC | PRN

## 2018-12-18 MED FILL — ZOLPIDEM TART ER 12.5 MG TA: 12.5 | 15 days supply | Qty: 15 | Fill #0

## 2018-12-18 NOTE — Telephone Encounter (Signed)
RX REFILL FLUTICASONE METOPROLO SUMATRIPTAN NORGESTIMATE [place], [place] - [place] [phone] (Phone) [phone] (Fax

## 2018-12-18 NOTE — Patient Instructions (Signed)
Please complete lab work prior to leaving.   

## 2018-12-18 NOTE — Telephone Encounter (Signed)
Please contact pt and let her know insurance will not cover lexapro 15 mg. They will only cover 10 or 20 mg.  I am happy to write for 20 if she is agreeable. Just let me know.

## 2018-12-18 NOTE — Progress Notes (Signed)
Subjective:    Patient ID: Lindsey Burton, female    DOB: [birth year], 43 y.o.   MRN: [id number]  HPI   Patient presents today for complete physical.  Immunizations:  Declines flu shot, reports last tetanus was 4 years ago at [facility] Diet:  fair Exercise:  Some walking Pap Smear: 2016 Mammogram:2018 Dental: up to date Vision: scheduled tomorrow  HTN- continues metoprolol BP Readings from Last 3 Encounters:  12/18/18 135/83  09/29/17 120/80  07/07/17 (!) 143/88   Migraines- reports that she only has right before menstrual cycle. And none recently.   Insomnia- Reports that she uses ambien on a prn basis.    Depression- reports that she continues lexapro. She stopped wellbutrin because it "pulled me down."  Reports that she and her husband separated. She feels like this will ultimately be a good thing but stressful at the same time. Daughter started college the same day.     Review of Systems  Constitutional: Negative for unexpected weight change.  HENT: Negative for hearing loss and rhinorrhea.   Eyes: Negative for visual disturbance.  Respiratory: Negative for cough and shortness of breath.   Cardiovascular: Negative for chest pain.  Gastrointestinal: Negative for constipation and diarrhea.       Some blood after BM on tissue  Genitourinary: Negative for dysuria, frequency and menstrual problem.  Neurological: Negative for headaches.  Hematological: Negative for adenopathy.  Psychiatric/Behavioral:       See HPI   Past Medical History:  Diagnosis Date  . Alopecia    Getting kenalog injections prn  . Anxiety   . Depression   . Hypertension   . Insomnia   . Migraines   . Migraines   . Vitamin D deficiency      Social History   Socioeconomic History  . Marital status: Married    Spouse name: Not on file  . Number of children: Not on file  . Years of education: Not on file  . Highest education level: Not on file  Occupational History  . Not on file   Social Needs  . Financial resource strain: Not on file  . Food insecurity    Worry: Not on file    Inability: Not on file  . Transportation needs    Medical: Not on file    Non-medical: Not on file  Tobacco Use  . Smoking status: Never Smoker  . Smokeless tobacco: Never Used  Substance and Sexual Activity  . Alcohol use: No  . Drug use: No  . Sexual activity: Not on file  Lifestyle  . Physical activity    Days per week: Not on file    Minutes per session: Not on file  . Stress: Not on file  Relationships  . Social Herbalist on phone: Not on file    Gets together: Not on file    Attends religious service: Not on file    Active member of club or organization: Not on file    Attends meetings of clubs or organizations: Not on file    Relationship status: Not on file  . Intimate partner violence    Fear of current or ex partner: Not on file    Emotionally abused: Not on file    Physically abused: Not on file    Forced sexual activity: Not on file  Other Topics Concern  . 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  Family History  Problem Relation Age of Onset  . Hypertension Father   . Diabetes Father   . Hyperlipidemia Father   . Heart attack Father   . Diabetes Sister   . Hypertension Sister   . Cancer Neg Hx     Allergies  Allergen Reactions  . Ace Inhibitors Swelling    angioedema   . Lisinopril Swelling    Current Outpatient Medications on File Prior to Visit  Medication Sig Dispense Refill  . fluticasone (FLONASE) 50 MCG/ACT nasal spray Place 2 sprays into the nose daily. 16 g 5  . metoprolol succinate (TOPROL-XL) 100 MG 24 hr tablet TAKE 1 TABLET(100 MG) BY MOUTH DAILY 30 tablet 4  . Multiple  Vitamins-Minerals (MULTIVITAMIN WITH MINERALS) tablet Take 1 tablet by mouth daily. 30 tablet   . Norgestimate-Ethinyl Estradiol Triphasic 0.18/0.215/0.25 MG-35 MCG tablet Take 1 tablet by mouth daily. 3 Package 4  . SUMAtriptan (IMITREX) 100 MG tablet One tab at start of headache, may repeat 2 hours later as needed. Max 2 dose per 24 hrs 10 tablet 5  . Triamcinolone Acetonide (KENALOG IJ) Inject 60 mg as directed. As needed or Every 1 1/2 months for Alopecia    . Vitamin D, Ergocalciferol, (DRISDOL) 50000 units CAPS capsule Take 1 capsule by mouth once a week.    . zolpidem (AMBIEN CR) 12.5 MG CR tablet Take 1 tablet (12.5 mg total) by mouth at bedtime as needed. 30 tablet 0   No current facility-administered medications on file prior to visit.     BP 135/83 (BP Location: Right Arm, Patient Position: Sitting, Cuff Size: Small)   Pulse 75   Temp 97.8 F (36.6 C) (Temporal)   Resp 16   Ht 5\' 3"  (1.6 m)   Wt 161 lb 12.8 oz (73.4 kg)   SpO2 100%   BMI 28.66 kg/m       Objective:   Physical Exam  Physical Exam  Constitutional: She is oriented to person, place, and time. She appears well-developed and well-nourished. No distress.  HENT:  Head: Normocephalic and atraumatic.  Right Ear: Tympanic membrane and ear canal normal.  Left Ear: Tympanic membrane and ear canal normal.  Mouth/Throat: Oropharynx is clear and moist.  Eyes: Pupils are equal, round, and reactive to light. No scleral icterus. (exopthalmos) Neck: Normal range of motion. No thyromegaly present.  Cardiovascular: Normal rate and regular rhythm.   No murmur heard. Pulmonary/Chest: Effort normal and breath sounds normal. No respiratory distress. He has no wheezes. She has no rales. She exhibits no tenderness.  Abdominal: Soft. Bowel sounds are normal. She exhibits no distension and no mass. There is no tenderness. There is no rebound and no guarding.  Musculoskeletal: She exhibits no edema.  Lymphadenopathy:    She has  no cervical adenopathy.  Neurological: She is alert and oriented to person, place, and time. She has normal patellar reflexes. She exhibits normal muscle tone. Coordination normal.  Skin: Skin is warm and dry.  Psychiatric: She has a normal mood and affect. Her behavior is normal. Judgment and thought content normal.  Breasts: Examined lying Right: Without masses, retractions, discharge or axillary adenopathy.  Left: Without masses, retractions, discharge or axillary adenopathy.  Inguinal/mons: Normal without inguinal adenopathy  External  genitalia: Normal  BUS/Urethra/Skene's glands: Normal  Bladder: Normal  Vagina: Normal  Cervix: Normal  Uterus: normal in size, shape and contour. Midline and mobile  Adnexa/parametria:  Rt: Without masses or tenderness.  Lt: Without masses or tenderness.  Anus and perineum: Normal            Assessment & Plan:   Preventative care- discussed healthy diet and regular exercise.  Pap performed today. Refer for mammogram.  Immunizations up to date.   Vit d deficiency- obtain follow up vit D level.  Insomnia- using ambien prn.  Controlled substance registry is up to date and will obtain follow up UDS. Refill sent.  HTN- bp at goal on current meds. Continue same.  Depression- she would like to increase lexapro- see telephone note re: insurance coverage for dosing changes.  Migraines- stable.   Assessment & Plan:

## 2018-12-18 NOTE — Telephone Encounter (Signed)
Escitalopram 10mg : 1.5 tablets by mouth daily not covered by plan. No further information given.

## 2018-12-19 MED ORDER — SUMATRIPTAN SUCCINATE 100 MG PO TABS: ORAL_TABLET | ORAL | 5 refills | Status: DC

## 2018-12-19 MED ORDER — METOPROLOL SUCCINATE ER 100 MG PO TB24: 100.0000 mg | ORAL_TABLET | Freq: Every day | ORAL | 5 refills | Status: DC

## 2018-12-19 MED ORDER — FLUTICASONE PROPIONATE 50 MCG/ACT NA SUSP: 2.0000 | Freq: Every day | NASAL | 5 refills | Status: DC

## 2018-12-19 MED ORDER — ESCITALOPRAM OXALATE 20 MG PO TABS: 20.0000 mg | ORAL_TABLET | Freq: Every day | ORAL | 1 refills | Status: DC

## 2018-12-19 MED ORDER — NORGESTIM-ETH ESTRAD TRIPHASIC 0.18/0.215/0.25 MG-35 MCG PO TABS: 1.0000 | ORAL_TABLET | Freq: Every day | ORAL | 4 refills | Status: DC

## 2018-12-19 NOTE — Telephone Encounter (Signed)
Pt stated that she had CPE yesterday and the following meds were not refilled. Also she is okay with escitalopram 20MG    fluticasone (FLONASE) 50 MCG/ACT nasal spray metoprolol succinate (TOPROL-XL) 100 MG 24 hr tablet Norgestimate-Ethinyl Estradiol Triphasic 0.18/0.215/0.25 MG-35 MCG tablet SUMAtriptan (IMITREX) 100 MG tablet  [place], [place] - [place] [phone] (Phone) [phone] (Fax)

## 2018-12-19 NOTE — Telephone Encounter (Signed)
Refills sent as well as lexapro 20mg .

## 2018-12-19 NOTE — Telephone Encounter (Signed)
Lvm for patient to call back about this information. 

## 2018-12-20 LAB — CYTOLOGY - PAP
Adequacy: ABSENT
Diagnosis: NEGATIVE
HPV: NOT DETECTED

## 2018-12-20 MED FILL — TRI-PREVIFEM 0.18/0.215/0.2: 0.18/0.215/ | 28 days supply | Qty: 28 | Fill #0

## 2018-12-20 MED FILL — METOPROLOL SUCCINATE ER 100: 100 | 30 days supply | Qty: 30 | Fill #0

## 2018-12-20 MED FILL — SUMATRIPTAN SUCC 100 MG TAB: 100 | 30 days supply | Qty: 10 | Fill #0

## 2018-12-20 MED FILL — FLUTICASONE PROP 50 MCG SPR: 50 | 30 days supply | Qty: 16 | Fill #0

## 2018-12-20 NOTE — Telephone Encounter (Signed)
RX was sent for 20mg  lexapro. See 12/19/18 Medication refill note.

## 2018-12-20 NOTE — Telephone Encounter (Signed)
Patient notified

## 2018-12-21 LAB — PAIN MGMT, PROFILE 8 W/CONF, U
6 Acetylmorphine: NEGATIVE ng/mL
Alcohol Metabolites: NEGATIVE ng/mL (ref <500)
Amphetamines: NEGATIVE ng/mL
Benzodiazepines: NEGATIVE ng/mL
Buprenorphine, Urine: NEGATIVE ng/mL
Cocaine Metabolite: NEGATIVE ng/mL
Creatinine: 115.3 mg/dL
MDMA: NEGATIVE ng/mL
Marijuana Metabolite: NEGATIVE ng/mL
Opiates: NEGATIVE ng/mL
Oxidant: NEGATIVE ug/mL
Oxycodone: NEGATIVE ng/mL
pH: 6.3 (ref 4.5–9.0)

## 2018-12-21 LAB — VITAMIN D 1,25 DIHYDROXY
Vitamin D 1, 25 (OH)2 Total: 61 pg/mL (ref 18–72)
Vitamin D2 1, 25 (OH)2: 14 pg/mL
Vitamin D3 1, 25 (OH)2: 47 pg/mL

## 2018-12-21 MED FILL — ESCITALOPRAM 20 MG TABLET: 20 | 30 days supply | Qty: 30 | Fill #0

## 2019-01-04 ENCOUNTER — Ambulatory Visit (HOSPITAL_BASED_OUTPATIENT_CLINIC_OR_DEPARTMENT_OTHER)
Admission: RE | Admit: 2019-01-04 | Discharge: 2019-01-04 | Disposition: A | Discharge status: Home / Self Care | Payer: BC Managed Care – PPO | Source: Ambulatory Visit | Attending: Family | Admitting: Family

## 2019-01-04 ENCOUNTER — Other Ambulatory Visit: Payer: Self-pay

## 2019-01-04 DIAGNOSIS — Z1231 Encounter for screening mammogram for malignant neoplasm of breast: Secondary | ICD-10-CM | POA: Diagnosis not present

## 2019-01-04 DIAGNOSIS — Z0000 Encounter for general adult medical examination without abnormal findings: Secondary | ICD-10-CM

## 2019-02-18 MED FILL — ESCITALOPRAM 20 MG TABLET: 20 | 30 days supply | Qty: 30 | Fill #1

## 2019-02-18 MED FILL — FLUTICASONE PROP 50 MCG SPR: 50 | 30 days supply | Qty: 16 | Fill #1

## 2019-02-18 MED FILL — ZOLPIDEM TART ER 12.5 MG TA: 12.5 | 15 days supply | Qty: 15 | Fill #1

## 2019-04-01 ENCOUNTER — Telehealth: Payer: Self-pay | Admitting: *Deleted

## 2019-04-01 ENCOUNTER — Ambulatory Visit (INDEPENDENT_AMBULATORY_CARE_PROVIDER_SITE_OTHER): Payer: BC Managed Care – PPO | Admitting: Family

## 2019-04-01 ENCOUNTER — Encounter: Payer: Self-pay | Admitting: Family

## 2019-04-01 ENCOUNTER — Other Ambulatory Visit: Payer: Self-pay

## 2019-04-01 DIAGNOSIS — B349 Viral infection, unspecified: Secondary | ICD-10-CM

## 2019-04-01 NOTE — Telephone Encounter (Signed)
Who Is Calling Patient / Member / Family / Caregiver Call Type Triage / Clinical Relationship To Patient Self Return Phone Number [phone] (Primary) Chief Complaint Headache Reason for Call Symptomatic / Request for Health Information Initial Comment Caller states that she would like to be tested for COVID. She has had the body aches, migraine and stuffy nose for the past week. She has also lost her taste and smell. Translation No Nurse Assessment Nurse: Chestine Spore, RN, [place] Date/Time (Eastern Time): 03/31/2019 3:21:15 PM Confirm and document reason for call. If symptomatic, describe symptoms. ---caller states having a headache. congestion in nose. achy. lost smell and taste. unknown temp. chills

## 2019-04-01 NOTE — Progress Notes (Signed)
Virtual Visit via Video Note  I connected with Lindsey Burton on 04/01/19 at 11:40 AM EST by a video enabled telemedicine application and verified that I am speaking with the correct person using two identifiers.  Location: Patient: home Provider: home   I discussed the limitations of evaluation and management by telemedicine and the availability of in person appointments. The patient expressed understanding and agreed to proceed.  History of Present Illness:  Patient is a 43 yr old female who presents today with complaint of fever up to 102, loss of taste/smell, cough, body aches.    Reports that on Monday she awoke with facial pain.  Felt like a sinus infection coming out without sore throat. Tuesday and Wednesday had body aches, low back pain, chills at night.  Woke up "drenched." Saturday taste and smell "went at the same time."  Still having body aches and fever.  She lives with her daughter who is 65 and feeling well.  Daughter has been going to virtual school.  She went out of town to a funeral.  Limit of 25 people.  She sat near the back of the church. There was a gathering back at the home after the funeral.  Past Medical History:  Diagnosis Date  . Alopecia    Getting kenalog injections prn  . Anxiety   . Depression   . Hypertension   . Insomnia   . Migraines   . Migraines   . Vitamin D deficiency      Social History   Socioeconomic History  . Marital status: Married    Spouse name: Not on file  . Number of children: Not on file  . Years of education: Not on file  . Highest education level: Not on file  Occupational History  . Not on file  Tobacco Use  . Smoking status: Never Smoker  . Smokeless tobacco: Never Used  Substance and Sexual Activity  . Alcohol use: No  . Drug use: No  . Sexual activity: Not on file  Other Topics Concern  . 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 Difficulty of Paying Living Expenses: Not on file  Food Insecurity:   . Worried About Charity fundraiser in the Last Year: Not on file  . Ran Out of Food in the Last Year: Not on file  Transportation Needs:   . Lack of Transportation (Medical): Not on file  . Lack of Transportation (Non-Medical): Not on file  Physical Activity:   . Days of Exercise per Week: Not on file  . Minutes of Exercise per Session: Not on file  Stress:   . Feeling of Stress : Not on file  Social Connections:   . Frequency of Communication with Friends and Family: Not on file  . Frequency of Social Gatherings with Friends and Family: Not on file  . Attends Religious Services: Not on file  . Active Member of Clubs or Organizations: Not on file  . Attends Archivist Meetings: Not on file  . Marital Status: Not on file  Intimate Partner Violence:   . Fear of Current or Ex-Partner: Not on file  . Emotionally Abused:  Not on file  . Physically Abused: Not on file  . Sexually Abused: Not on file    Past Surgical History:  Procedure Laterality Date  . LIPOSUCTION  09/2017    Family History  Problem Relation Age of Onset  . Hypertension Father   . Diabetes Father   . Hyperlipidemia Father   . Heart attack Father   . Diabetes Sister   . Hypertension Sister   . Cancer Neg Hx     Allergies  Allergen Reactions  . Ace Inhibitors Swelling    angioedema   . Lisinopril Swelling    Current Outpatient Medications on File Prior to Visit  Medication Sig Dispense Refill  . escitalopram (LEXAPRO) 20 MG tablet Take 1 tablet (20 mg total) by mouth daily. 30 tablet 1  . fluticasone (FLONASE) 50 MCG/ACT nasal spray Place 2 sprays into both nostrils daily. 16 g 5  . metoprolol succinate (TOPROL-XL) 100 MG 24 hr  tablet Take 1 tablet (100 mg total) by mouth daily. Take with or immediately following a meal. 30 tablet 5  . Multiple Vitamins-Minerals (MULTIVITAMIN WITH MINERALS) tablet Take 1 tablet by mouth daily. 30 tablet   . Norgestimate-Ethinyl Estradiol Triphasic 0.18/0.215/0.25 MG-35 MCG tablet Take 1 tablet by mouth daily. 3 Package 4  . SUMAtriptan (IMITREX) 100 MG tablet One tab at start of headache, may repeat 2 hours later as needed. Max 2 dose per 24 hrs 10 tablet 5  . Triamcinolone Acetonide (KENALOG IJ) Inject 60 mg as directed. As needed or Every 1 1/2 months for Alopecia    . zolpidem (AMBIEN CR) 12.5 MG CR tablet Take 1 tablet (12.5 mg total) by mouth at bedtime as needed. 30 tablet 0   No current facility-administered medications on file prior to visit.    There were no vitals taken for this visit.     Observations/Objective:   Gen: Awake, alert, no acute distress Resp: Breathing is even and non-labored Psych: calm/pleasant demeanor Neuro: Alert and Oriented x 3, + facial symmetry, speech is clear.   Assessment and Plan:  Viral illness- symptoms concerning for COVID-19 infection. I have advised the patient to go to our testing center today for testing.  We discussed supportive measures and red flags that should prompt ED evaluation such as shortness of breath. We also discussed CDC quarantine recommendations:  Patient was advised to quarantine as follows following positive COVID-19 result:  At least 10 days have passed since symptom onset and At least 24 hours have passed since resolution of fever without the use of fever-reducing medications and Other symptoms have improved.  Follow Up Instructions:    I discussed the assessment and treatment plan with the patient. The patient was provided an opportunity to ask questions and all were answered. The patient agreed with the plan and demonstrated an understanding of the instructions.   The patient was advised to call back or  seek an in-person evaluation if the symptoms worsen or if the condition fails to improve as anticipated.  Lemont Fillers, NP

## 2019-04-01 NOTE — Telephone Encounter (Signed)
Appointment made for today.  Patient stated that her temp today was 102

## 2019-04-03 ENCOUNTER — Other Ambulatory Visit: Payer: Self-pay | Admitting: Family

## 2019-04-03 ENCOUNTER — Ambulatory Visit: Payer: BC Managed Care – PPO | Admitting: Family

## 2019-04-03 ENCOUNTER — Telehealth: Payer: Self-pay | Admitting: Family

## 2019-04-03 MED ORDER — AMOXICILLIN-POT CLAVULANATE 875-125 MG PO TABS: 1.0000 | ORAL_TABLET | Freq: Two times a day (BID) | ORAL | 0 refills | Status: DC

## 2019-04-03 MED FILL — AMOX-CLAV 875-125 MG TABLET: 875-125 | 10 days supply | Qty: 20 | Fill #0

## 2019-04-03 MED FILL — FLUTICASONE PROP 50 MCG SPR: 50 | 30 days supply | Qty: 16 | Fill #2

## 2019-04-03 MED FILL — SUMATRIPTAN SUCC 100 MG TAB: 100 | 30 days supply | Qty: 10 | Fill #1

## 2019-04-03 MED FILL — TRI-PREVIFEM 0.18/0.215/0.2: 0.18/0.215/ | 28 days supply | Qty: 28 | Fill #1

## 2019-04-03 MED FILL — METOPROLOL SUCCINATE ER 100: 100 | 30 days supply | Qty: 30 | Fill #1

## 2019-04-03 NOTE — Telephone Encounter (Signed)
Patient states PCP advise her to follow up regarding the outcome of her COVID results which came back not detected (patient was tested at [facility]), patient states sinus pressure has not improved seeking a Rx, please send to    [place], [place]

## 2019-04-03 NOTE — Telephone Encounter (Signed)
Spoke to pt. advised her to begin Augmentin twice daily.  Prescription was sent to the [facility] center pharmacy.  Patient aware.

## 2019-04-04 MED FILL — ZOLPIDEM TART ER 12.5 MG TA: 12.5 | 15 days supply | Qty: 15 | Fill #0

## 2019-05-23 MED FILL — METOPROLOL SUCCINATE ER 100: 100 | 30 days supply | Qty: 30 | Fill #2

## 2019-05-23 MED FILL — ZOLPIDEM TART ER 12.5 MG TA: 12.5 | 15 days supply | Qty: 15 | Fill #1

## 2019-05-23 MED FILL — FLUTICASONE PROP 50 MCG SPR: 50 | 30 days supply | Qty: 16 | Fill #3

## 2019-06-18 ENCOUNTER — Telehealth: Payer: Self-pay | Admitting: Family

## 2019-06-18 ENCOUNTER — Other Ambulatory Visit: Payer: Self-pay

## 2019-06-18 ENCOUNTER — Ambulatory Visit: Payer: BC Managed Care – PPO | Admitting: Family

## 2019-06-18 VITALS — BP 141/85 | HR 96 | Temp 97.6°F | Resp 16 | Ht 63.0 in | Wt 164.0 lb

## 2019-06-18 DIAGNOSIS — J309 Allergic rhinitis, unspecified: Secondary | ICD-10-CM | POA: Diagnosis not present

## 2019-06-18 DIAGNOSIS — F32A Depression, unspecified: Secondary | ICD-10-CM

## 2019-06-18 DIAGNOSIS — G4700 Insomnia, unspecified: Secondary | ICD-10-CM

## 2019-06-18 DIAGNOSIS — G43909 Migraine, unspecified, not intractable, without status migrainosus: Secondary | ICD-10-CM | POA: Diagnosis not present

## 2019-06-18 DIAGNOSIS — F419 Anxiety disorder, unspecified: Secondary | ICD-10-CM

## 2019-06-18 DIAGNOSIS — F329 Major depressive disorder, single episode, unspecified: Secondary | ICD-10-CM

## 2019-06-18 DIAGNOSIS — I10 Essential (primary) hypertension: Secondary | ICD-10-CM

## 2019-06-18 NOTE — Telephone Encounter (Signed)
Please contact patient via phone as soon as possible , Patient stated she spoke with someone a month ago , no  documentation in chart . [phone] please 3/2/21dt

## 2019-06-18 NOTE — Telephone Encounter (Signed)
Patient came into office regarding a [facility] bill , for physical on 12/18/2018. Patient would like to be contacted regarding bill. Patient states insurance should cover test bill. 3/2/21DT

## 2019-06-18 NOTE — Progress Notes (Signed)
Subjective:    Patient ID: Lindsey Burton, female    DOB: [birth year], 44 y.o.   MRN: [id number]  HPI  Patient is a 44 year old female who presents today for routine follow-up.  Insomnia- uses ambien PRN.  She reports that sometimes she will use an over-the-counter sleep aid instead of Ambien.  She uses Ambien about half the time.  She reports sleeping well with this regimen.  Anxiety/depression- reports that her anxiety is well controlled and mood is stable on lexapro.  Migraines- reports that she has not had any recent migraines.  HTN- Maintained on toprol xl.   BP Readings from Last 3 Encounters:  06/18/19 (!) 141/85  12/18/18 135/83  09/29/17 120/80   Allergic rhinitis- uses flonase daily. Reports that her symptoms are well controlled.      Review of Systems  See HPI  Past Medical History:  Diagnosis Date  . Alopecia    Getting kenalog injections prn  . Anxiety   . Depression   . Hypertension   . Insomnia   . Migraines   . Migraines   . Vitamin D deficiency      Social History   Socioeconomic History  . Marital status: Married    Spouse name: Not on file  . Number of children: Not on file  . Years of education: Not on file  . Highest education level: Not on file  Occupational History  . Not on file  Tobacco Use  . Smoking status: Never Smoker  . Smokeless tobacco: Never Used  Substance and Sexual Activity  . Alcohol use: No  . Drug use: No  . Sexual activity: Not on file  Other Topics Concern  . 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 Difficulty of Paying Living Expenses: Not on file  Food Insecurity:   . Worried About Programme researcher, broadcasting/film/video in the Last Year: Not on file  . Ran Out  of Food in the Last Year: Not on file  Transportation Needs:   . Lack of Transportation (Medical): Not on file  . Lack of Transportation (Non-Medical): Not on file  Physical Activity:   . Days of Exercise per Week: Not on file  . Minutes of Exercise per Session: Not on file  Stress:   . Feeling of Stress : Not on file  Social Connections:   . Frequency of Communication with Friends and Family: Not on file  . Frequency of Social Gatherings with Friends and Family: Not on file  . Attends Religious Services: Not on file  . Active Member of Clubs or Organizations: Not on file  . Attends Banker Meetings: Not on file  . Marital Status: Not on file  Intimate Partner Violence:   . Fear of Current or Ex-Partner: Not on file  . Emotionally Abused: Not on file  . Physically Abused: Not on file  . Sexually Abused: Not on file    Past Surgical History:  Procedure Laterality Date  . LIPOSUCTION  09/2017    Family History  Problem Relation Age of Onset  . Hypertension Father   . Diabetes Father   . Hyperlipidemia Father   . Heart  attack Father   . Diabetes Sister   . Hypertension Sister   . Cancer Neg Hx     Allergies  Allergen Reactions  . Ace Inhibitors Swelling    angioedema   . Lisinopril Swelling    Current Outpatient Medications on File Prior to Visit  Medication Sig Dispense Refill  . escitalopram (LEXAPRO) 20 MG tablet Take 1 tablet (20 mg total) by mouth daily. 30 tablet 1  . fluticasone (FLONASE) 50 MCG/ACT nasal spray Place 2 sprays into both nostrils daily. 16 g 5  . metoprolol succinate (TOPROL-XL) 100 MG 24 hr tablet Take 1 tablet (100 mg total) by mouth daily. Take with or immediately following a meal. 30 tablet 5  . Multiple Vitamins-Minerals (MULTIVITAMIN WITH MINERALS) tablet Take 1 tablet by mouth daily. 30 tablet   . Norgestimate-Ethinyl Estradiol Triphasic 0.18/0.215/0.25 MG-35 MCG tablet Take 1 tablet by mouth daily. 3 Package 4  .  SUMAtriptan (IMITREX) 100 MG tablet One tab at start of headache, may repeat 2 hours later as needed. Max 2 dose per 24 hrs 10 tablet 5  . zolpidem (AMBIEN CR) 12.5 MG CR tablet TAKE 1 TABLET (12.5 MG TOTAL) BY MOUTH AT BEDTIME AS NEEDED. 30 tablet 0  . Triamcinolone Acetonide (KENALOG IJ) Inject 60 mg as directed. As needed or Every 1 1/2 months for Alopecia     No current facility-administered medications on file prior to visit.    BP (!) 141/85 (BP Location: Right Arm, Patient Position: Sitting, Cuff Size: Large)   Pulse 96   Temp 97.6 F (36.4 C) (Temporal)   Resp 16   Ht 5\' 3"  (1.6 m)   Wt 164 lb (74.4 kg)   SpO2 100%   BMI 29.05 kg/m       Objective:   Physical Exam Constitutional:      Appearance: She is well-developed.  Cardiovascular:     Rate and Rhythm: Normal rate and regular rhythm.     Heart sounds: Normal heart sounds. No murmur.  Pulmonary:     Effort: Pulmonary effort is normal. No respiratory distress.     Breath sounds: Normal breath sounds. No wheezing.  Psychiatric:        Behavior: Behavior normal.        Thought Content: Thought content normal.        Judgment: Judgment normal.           Assessment & Plan:  Hypertension-blood pressure is fair today.  We will continue current meds/doses.  Allergic rhinitis-well-controlled on Flonase.  Continue same.  Migraines-currently well controlled.  She has Imitrex on hand for as needed use.  Insomnia-stable on current regimen of as needed Ambien.  Controlled substance contract is up to date.  Will obtain urine drug screen per protocol.  Anxiety/depression-stable on current dose of Lexapro.  Continue same.  This visit occurred during the SARS-CoV-2 public health emergency.  Safety protocols were in place, including screening questions prior to the visit, additional usage of staff PPE, and extensive cleaning of exam room while observing appropriate contact time as indicated for disinfecting solutions.

## 2019-06-19 ENCOUNTER — Encounter: Payer: Self-pay | Admitting: Family

## 2019-06-19 LAB — PAIN MGMT, PROFILE 8 W/CONF, U
6 Acetylmorphine: NEGATIVE ng/mL
Alcohol Metabolites: NEGATIVE ng/mL (ref <500)
Amphetamines: NEGATIVE ng/mL
Benzodiazepines: NEGATIVE ng/mL
Buprenorphine, Urine: NEGATIVE ng/mL
Cocaine Metabolite: NEGATIVE ng/mL
Creatinine: >300 mg/dL
MDMA: NEGATIVE ng/mL
Marijuana Metabolite: NEGATIVE ng/mL
Opiates: NEGATIVE ng/mL
Oxidant: NEGATIVE ug/mL
Oxycodone: NEGATIVE ng/mL
pH: 6.4 (ref 4.5–9.0)

## 2019-06-19 LAB — BASIC METABOLIC PANEL
BUN: 10 mg/dL (ref 6–23)
CO2: 29 mEq/L (ref 19–32)
Calcium: 9.1 mg/dL (ref 8.4–10.5)
Chloride: 102 mEq/L (ref 96–112)
Creatinine, Ser: 0.69 mg/dL (ref 0.40–1.20)
GFR: 111.01 mL/min (ref >60.00)
Glucose, Bld: 163 mg/dL — ABNORMAL HIGH (ref 70–99)
Potassium: 4 mEq/L (ref 3.5–5.1)
Sodium: 141 mEq/L (ref 135–145)

## 2019-06-19 NOTE — Telephone Encounter (Signed)
SWP and the lab in question is for Vit D.  I explained to the pt that this is a lab that is outside the guidelines of the CPE realm.  She was questioning if she would be responsible for the $29 [facility] fee.  I did let her know she would be responsible for it.  She had been calling and speaking with [facility], not our office over the last month regarding this bill.  I suggested to her to consult with her insurance company before her next CPE to see exactly what labs they will cover at no charge to her prior to seeing Melissa.

## 2019-07-24 ENCOUNTER — Other Ambulatory Visit: Payer: Self-pay | Admitting: Family

## 2019-07-24 MED FILL — FLUTICASONE PROP 50 MCG SPR: 50 | 30 days supply | Qty: 16 | Fill #4

## 2019-07-24 MED FILL — METOPROLOL SUCCINATE ER 100: 100 | 30 days supply | Qty: 30 | Fill #3

## 2019-07-24 MED FILL — TRI-PREVIFEM 0.18/0.215/0.2: 0.18/0.215/ | 28 days supply | Qty: 28 | Fill #2

## 2019-07-25 MED FILL — ZOLPIDEM TART ER 12.5 MG TA: 12.5 | 15 days supply | Qty: 15 | Fill #0

## 2019-08-06 ENCOUNTER — Other Ambulatory Visit: Payer: Self-pay

## 2019-08-09 ENCOUNTER — Other Ambulatory Visit: Payer: Self-pay

## 2019-08-20 ENCOUNTER — Other Ambulatory Visit: Payer: Self-pay

## 2019-08-20 MED ORDER — METOPROLOL SUCCINATE ER 100 MG PO TB24: 100.0000 mg | ORAL_TABLET | Freq: Every day | ORAL | 1 refills | Status: DC

## 2019-11-04 DIAGNOSIS — L638 Other alopecia areata: Secondary | ICD-10-CM | POA: Diagnosis not present

## 2019-11-18 ENCOUNTER — Other Ambulatory Visit: Payer: Self-pay | Admitting: Family

## 2019-11-18 MED FILL — FLUTICASONE PROP 50 MCG SPR: 50 | 30 days supply | Qty: 16 | Fill #5

## 2019-11-18 MED FILL — TRI-PREVIFEM 0.18/0.215/0.2: 0.18/0.215/ | 28 days supply | Qty: 28 | Fill #3

## 2019-11-18 NOTE — Telephone Encounter (Signed)
Last office visit- 06-18-2019 Last refill- 07-25-2019 Next office visit- not scheduled  UDS- 06-18-2019

## 2019-11-19 MED FILL — ZOLPIDEM TART ER 12.5 MG TA: 12.5 | 15 days supply | Qty: 15 | Fill #0

## 2019-12-17 MED FILL — SUMATRIPTAN SUCC 100 MG TAB: 100 | 30 days supply | Qty: 10 | Fill #2

## 2019-12-17 MED FILL — TRI-PREVIFEM 0.18/0.215/0.2: 0.18/0.215/ | 28 days supply | Qty: 28 | Fill #4

## 2019-12-30 DIAGNOSIS — L638 Other alopecia areata: Secondary | ICD-10-CM | POA: Diagnosis not present

## 2020-01-17 ENCOUNTER — Encounter: Payer: Self-pay | Admitting: Family

## 2020-01-17 ENCOUNTER — Other Ambulatory Visit: Payer: Self-pay

## 2020-01-17 ENCOUNTER — Ambulatory Visit (INDEPENDENT_AMBULATORY_CARE_PROVIDER_SITE_OTHER): Payer: BC Managed Care – PPO | Admitting: Family

## 2020-01-17 ENCOUNTER — Other Ambulatory Visit: Payer: Self-pay | Admitting: Family

## 2020-01-17 VITALS — BP 137/85 | HR 80 | Temp 98.8°F | Resp 16 | Wt 156.0 lb

## 2020-01-17 DIAGNOSIS — Z0000 Encounter for general adult medical examination without abnormal findings: Secondary | ICD-10-CM | POA: Diagnosis not present

## 2020-01-17 DIAGNOSIS — Z23 Encounter for immunization: Secondary | ICD-10-CM

## 2020-01-17 DIAGNOSIS — Z8639 Personal history of other endocrine, nutritional and metabolic disease: Secondary | ICD-10-CM

## 2020-01-17 DIAGNOSIS — G4700 Insomnia, unspecified: Secondary | ICD-10-CM | POA: Diagnosis not present

## 2020-01-17 DIAGNOSIS — E785 Hyperlipidemia, unspecified: Secondary | ICD-10-CM

## 2020-01-17 DIAGNOSIS — E1169 Type 2 diabetes mellitus with other specified complication: Secondary | ICD-10-CM | POA: Diagnosis not present

## 2020-01-17 DIAGNOSIS — F32A Depression, unspecified: Secondary | ICD-10-CM

## 2020-01-17 MED ORDER — ESCITALOPRAM OXALATE 20 MG PO TABS: 20.0000 mg | ORAL_TABLET | Freq: Every day | ORAL | 1 refills | Status: DC

## 2020-01-17 MED ORDER — FLUTICASONE PROPIONATE 50 MCG/ACT NA SUSP: 2.0000 | Freq: Every day | NASAL | 5 refills | Status: DC

## 2020-01-17 MED ORDER — ZOLPIDEM TARTRATE ER 12.5 MG PO TBCR: 12.5000 mg | EXTENDED_RELEASE_TABLET | Freq: Every evening | ORAL | 0 refills | Status: DC | PRN

## 2020-01-17 MED ORDER — NORGESTIM-ETH ESTRAD TRIPHASIC 0.18/0.215/0.25 MG-35 MCG PO TABS: 1.0000 | ORAL_TABLET | Freq: Every day | ORAL | 4 refills | Status: DC

## 2020-01-17 MED ORDER — SUMATRIPTAN SUCCINATE 100 MG PO TABS: ORAL_TABLET | ORAL | 5 refills | Status: DC

## 2020-01-17 MED ORDER — METOPROLOL SUCCINATE ER 100 MG PO TB24: 100.0000 mg | ORAL_TABLET | Freq: Every day | ORAL | 1 refills | Status: DC

## 2020-01-17 MED FILL — SUMATRIPTAN SUCC 100 MG TAB: 100 | 10 days supply | Qty: 10 | Fill #0

## 2020-01-17 MED FILL — FLUTICASONE PROP 50 MCG SPR: 50 | 30 days supply | Qty: 16 | Fill #0

## 2020-01-17 MED FILL — ZOLPIDEM TART ER 12.5 MG TA: 12.5 | 15 days supply | Qty: 15 | Fill #0

## 2020-01-17 MED FILL — TRI-PREVIFEM 0.18/0.215/0.2: 0.18/0.215/ | 28 days supply | Qty: 28 | Fill #0

## 2020-01-17 MED FILL — ESCITALOPRAM 20 MG TABLET: 20 | 30 days supply | Qty: 30 | Fill #0

## 2020-01-17 MED FILL — METOPROLOL SUCCINATE ER 100: 100 | 30 days supply | Qty: 30 | Fill #0

## 2020-01-17 NOTE — Progress Notes (Addendum)
Subjective:    Patient ID: Lindsey Burton, female    DOB: Mar 08, 1976, 44 y.o.   MRN: [id number]  HPI  Patient presents today for complete physical and follow up on routine medical issues.   Immunizations: completed pfizer series, due for Td and flu (declines flu shot) Diet: healthy Exercise:  Treadmill and bicycle Pap Smear:  12/18/18 Mammogram:due  Depression- declines therapy. She does note feeling down sometimes but states that she really has everything that she needs. She continues lexapro 20mg  once daily.  Hyperlipidemia- not on statin.   Depression screen [facility] 2/9 01/17/2020 06/02/2017  Decreased Interest 1 2  Down, Depressed, Hopeless 2 2  PHQ - 2 Score 3 4  Altered sleeping 3 2  Tired, decreased energy 1 2  Change in appetite 1 1  Feeling bad or failure about yourself  3 2  Trouble concentrating 0 1  Moving slowly or fidgety/restless 1 0  Suicidal thoughts 0 0  PHQ-9 Score 12 12  Difficult doing work/chores Not difficult at all -   Hx of hyperthyroid- weight is down a bit, but she has been quite active Wt Readings from Last 3 Encounters:  01/17/20 156 lb (70.8 kg)  06/18/19 164 lb (74.4 kg)  12/18/18 161 lb 12.8 oz (73.4 kg)   HTN- current medication is toprol xl 100mg  once daily.   Insomnia- she uses ambien sparingly but notes insomnia is always a problem for her.    Review of Systems  Constitutional: Negative for unexpected weight change.  HENT: Negative for hearing loss and rhinorrhea.   Eyes: Negative for visual disturbance.  Respiratory: Negative for cough and shortness of breath.   Cardiovascular: Negative for chest pain.  Gastrointestinal: Negative for blood in stool, constipation and diarrhea.  Genitourinary: Negative for dysuria, frequency and menstrual problem.  Musculoskeletal: Negative for arthralgias and myalgias.  Skin: Negative for rash.  Neurological: Negative for headaches.  Hematological: Negative for adenopathy.    Psychiatric/Behavioral:       Some depression and anxiety- stress related       Past Medical History:  Diagnosis Date  . Alopecia    Getting kenalog injections prn  . Anxiety   . Depression   . Hypertension   . Insomnia   . Migraines   . Migraines   . Vitamin D deficiency      Social History   Socioeconomic History  . Marital status: Married    Spouse name: Not on file  . Number of children: Not on file  . Years of education: Not on file  . Highest education level: Not on file  Occupational History  . Not on file  Tobacco Use  . Smoking status: Never Smoker  . Smokeless tobacco: Never Used  Substance and Sexual Activity  . Alcohol use: No  . Drug use: No  . Sexual activity: Not on file  Other Topics Concern  . Not on file  Social History Narrative   Daughter, age 99 autistic Does well in school   Works at - works part time in 15   Married- husband lives with her "but we are not together."    Enjoys TV   Has fish   Social Determinants of Health   Financial Resource Strain:   . Difficulty of Paying Living Expenses: Not on file  Food Insecurity:   . Worried About [employer] in the Last Year: Not on file  . Ran Out of Food in the Last Year:  Not on file  Transportation Needs:   . Lack of Transportation (Medical): Not on file  . Lack of Transportation (Non-Medical): Not on file  Physical Activity:   . Days of Exercise per Week: Not on file  . Minutes of Exercise per Session: Not on file  Stress:   . Feeling of Stress : Not on file  Social Connections:   . Frequency of Communication with Friends and Family: Not on file  . Frequency of Social Gatherings with Friends and Family: Not on file  . Attends Religious Services: Not on file  . Active Member of Clubs or Organizations: Not on file  . Attends Banker Meetings: Not on file  . Marital Status: Not on file  Intimate Partner Violence:   . Fear of Current  or Ex-Partner: Not on file  . Emotionally Abused: Not on file  . Physically Abused: Not on file  . Sexually Abused: Not on file    Past Surgical History:  Procedure Laterality Date  . LIPOSUCTION  09/2017    Family History  Problem Relation Age of Onset  . Hypertension Father   . Diabetes Father   . Hyperlipidemia Father   . Heart attack Father   . Diabetes Sister   . Hypertension Sister   . Cancer Neg Hx     Allergies  Allergen Reactions  . Ace Inhibitors Swelling    angioedema   . Lisinopril Swelling    Current Outpatient Medications on File Prior to Visit  Medication Sig Dispense Refill  . escitalopram (LEXAPRO) 20 MG tablet Take 1 tablet (20 mg total) by mouth daily. 30 tablet 1  . fluticasone (FLONASE) 50 MCG/ACT nasal spray Place 2 sprays into both nostrils daily. 16 g 5  . metoprolol succinate (TOPROL-XL) 100 MG 24 hr tablet Take 1 tablet (100 mg total) by mouth daily. Take with or immediately following a meal. 90 tablet 1  . Multiple Vitamins-Minerals (MULTIVITAMIN WITH MINERALS) tablet Take 1 tablet by mouth daily. 30 tablet   . Norgestimate-Ethinyl Estradiol Triphasic 0.18/0.215/0.25 MG-35 MCG tablet Take 1 tablet by mouth daily. 3 Package 4  . SUMAtriptan (IMITREX) 100 MG tablet One tab at start of headache, may repeat 2 hours later as needed. Max 2 dose per 24 hrs 10 tablet 5  . Tazarotene (ARAZLO) 0.045 % LOTN Apply topically.    . Triamcinolone Acetonide (KENALOG IJ) Inject 60 mg as directed. As needed or Every 1 1/2 months for Alopecia    . zolpidem (AMBIEN CR) 12.5 MG CR tablet TAKE 1 TABLET (12.5 MG TOTAL) BY MOUTH AT BEDTIME AS NEEDED. 15 tablet 0   No current facility-administered medications on file prior to visit.    BP 137/85 (BP Location: Left Arm, Patient Position: Sitting, Cuff Size: Small)   Pulse 80   Temp 98.8 F (37.1 C) (Oral)   Resp 16   Wt 156 lb (70.8 kg)   SpO2 100%   BMI 27.63 kg/m    Objective:   Physical Exam Physical Exam   Constitutional: She is oriented to person, place, and time. She appears well-developed and well-nourished. No distress.  HENT:  Head: Normocephalic and atraumatic.  Right Ear: Tympanic membrane and ear canal normal.  Left Ear: Tympanic membrane and ear canal normal.  Mouth/Throat: not examined- pt wearing mask Eyes: Pupils are equal, round, and reactive to light. No scleral icterus. + exophthalmos bilaterally Neck: Normal range of motion. No thyromegaly present.  Cardiovascular: Normal rate and regular rhythm.  No murmur heard. Pulmonary/Chest: Effort normal and breath sounds normal. No respiratory distress. He has no wheezes. She has no rales. She exhibits no tenderness.  Abdominal: Soft. Bowel sounds are normal. She exhibits no distension and no mass. There is no tenderness. There is no rebound and no guarding.  Musculoskeletal: She exhibits no edema.  Lymphadenopathy:    She has no cervical adenopathy.  Neurological: She is alert and oriented to person, place, and time. She has normal patellar reflexes. She exhibits normal muscle tone. Coordination normal.  Skin: Skin is warm and dry.  Psychiatric: She has a normal mood and affect. Her behavior is normal. Judgment and thought content normal.  Breasts: Examined lying Right: Without masses, retractions, discharge or axillary adenopathy.  Left: Without masses, retractions, discharge or axillary adenopathy.  Pelvic: deferred           Assessment & Plan:  Preventative care- encouraged pt to continue healthy diet, exercise.  Completed covid series. Td today. Declines flu shot. She will schedule mammo. Pap up to date.  Depression- fair control on lexapro 20mg  once daily. Advised pt to continue.  I think she would also benefit from counseling- however she is not ready to do this.  HTN- bP at goal. Continue toprol xl 100mg .  BP Readings from Last 3 Encounters:  01/17/20 137/85  06/18/19 (!) 141/85  12/18/18 135/83   Insomnia-  this si an ongoing issue for her, but she feels that ambien Cr 12.5mg  continues to help her. Continue same.  UDS performed today, controlled substance contract updated.   Hyperlipidemia- obtain follow up lipid panel.  This visit occurred during the SARS-CoV-2 public health emergency.  Safety protocols were in place, including screening questions prior to the visit, additional usage of staff PPE, and extensive cleaning of exam room while observing appropriate contact time as indicated for disinfecting solutions.          Assessment & Plan:

## 2020-01-17 NOTE — Patient Instructions (Signed)
Please complete lab work prior to leaving. Keep up the good work with healthy diet, exercise. Schedule mammogram on the first floor.

## 2020-01-18 LAB — COMPREHENSIVE METABOLIC PANEL
AG Ratio: 1.4 (calc) (ref 1.0–2.5)
ALT: 14 U/L (ref 6–29)
AST: 23 U/L (ref 10–35)
Albumin: 4.4 g/dL (ref 3.6–5.1)
Alkaline phosphatase (APISO): 112 U/L (ref 31–125)
BUN: 12 mg/dL (ref 7–25)
CO2: 29 mmol/L (ref 20–32)
Calcium: 9.8 mg/dL (ref 8.6–10.2)
Chloride: 103 mmol/L (ref 98–110)
Creat: 0.77 mg/dL (ref 0.50–1.10)
Globulin: 3.1 g/dL (calc) (ref 1.9–3.7)
Glucose, Bld: 108 mg/dL — ABNORMAL HIGH (ref 65–99)
Potassium: 4.3 mmol/L (ref 3.5–5.3)
Sodium: 141 mmol/L (ref 135–146)
Total Bilirubin: 0.6 mg/dL (ref 0.2–1.2)
Total Protein: 7.5 g/dL (ref 6.1–8.1)

## 2020-01-18 LAB — DRUG MONITORING, PANEL 8 WITH CONFIRMATION, URINE
6 Acetylmorphine: NEGATIVE ng/mL (ref <10)
Alcohol Metabolites: NEGATIVE ng/mL
Amphetamines: NEGATIVE ng/mL (ref <500)
Benzodiazepines: NEGATIVE ng/mL (ref <100)
Buprenorphine, Urine: NEGATIVE ng/mL (ref <5)
Cocaine Metabolite: NEGATIVE ng/mL (ref <150)
Creatinine: >300 mg/dL
MDMA: NEGATIVE ng/mL (ref <500)
Marijuana Metabolite: NEGATIVE ng/mL (ref <20)
Opiates: NEGATIVE ng/mL (ref <100)
Oxidant: NEGATIVE ug/mL
Oxycodone: NEGATIVE ng/mL (ref <100)
pH: 6.2 (ref 4.5–9.0)

## 2020-01-18 LAB — LIPID PANEL
Cholesterol: 229 mg/dL — ABNORMAL HIGH (ref <200)
HDL: 54 mg/dL (ref >=50)
LDL Cholesterol (Calc): 144 mg/dL (calc) — ABNORMAL HIGH
Non-HDL Cholesterol (Calc): 175 mg/dL (calc) — ABNORMAL HIGH (ref <130)
Total CHOL/HDL Ratio: 4.2 (calc) (ref <5.0)
Triglycerides: 176 mg/dL — ABNORMAL HIGH (ref <150)

## 2020-01-18 LAB — TSH: TSH: 0.99 mIU/L

## 2020-01-18 LAB — DM TEMPLATE

## 2020-01-21 NOTE — Addendum Note (Signed)
Addended by: Sandford Craze on: 01/21/2020 07:13 AM   Modules accepted: Level of Service

## 2020-02-03 ENCOUNTER — Ambulatory Visit (HOSPITAL_BASED_OUTPATIENT_CLINIC_OR_DEPARTMENT_OTHER): Payer: BC Managed Care – PPO

## 2020-02-10 ENCOUNTER — Ambulatory Visit (HOSPITAL_BASED_OUTPATIENT_CLINIC_OR_DEPARTMENT_OTHER)
Admission: RE | Admit: 2020-02-10 | Discharge: 2020-02-10 | Disposition: A | Discharge status: Home / Self Care | Payer: BC Managed Care – PPO | Source: Ambulatory Visit | Attending: Family | Admitting: Family

## 2020-02-10 ENCOUNTER — Other Ambulatory Visit: Payer: Self-pay

## 2020-02-10 ENCOUNTER — Encounter (HOSPITAL_BASED_OUTPATIENT_CLINIC_OR_DEPARTMENT_OTHER): Payer: Self-pay

## 2020-02-10 DIAGNOSIS — Z1231 Encounter for screening mammogram for malignant neoplasm of breast: Secondary | ICD-10-CM | POA: Diagnosis not present

## 2020-02-10 DIAGNOSIS — Z0000 Encounter for general adult medical examination without abnormal findings: Secondary | ICD-10-CM

## 2020-02-19 MED FILL — METOPROLOL SUCCINATE ER 100: 100 | 30 days supply | Qty: 30 | Fill #1

## 2020-02-19 MED FILL — TRI-PREVIFEM 0.18/0.215/0.2: 0.18/0.215/ | 28 days supply | Qty: 28 | Fill #1

## 2020-02-19 MED FILL — ESCITALOPRAM 20 MG TABLET: 20 | 30 days supply | Qty: 30 | Fill #1

## 2020-02-19 MED FILL — FLUTICASONE PROP 50 MCG SPR: 50 | 30 days supply | Qty: 16 | Fill #1

## 2020-02-21 ENCOUNTER — Other Ambulatory Visit: Payer: Self-pay | Admitting: Family

## 2020-02-21 MED FILL — ZOLPIDEM TART ER 12.5 MG TA: 12.5 | 15 days supply | Qty: 15 | Fill #0

## 2020-03-04 ENCOUNTER — Other Ambulatory Visit: Payer: Self-pay | Admitting: *Deleted

## 2020-03-04 MED ORDER — FLUTICASONE PROPIONATE 50 MCG/ACT NA SUSP: 2.0000 | Freq: Every day | NASAL | 1 refills | Status: DC

## 2020-03-04 MED ORDER — NORGESTIM-ETH ESTRAD TRIPHASIC 0.18/0.215/0.25 MG-35 MCG PO TABS: 1.0000 | ORAL_TABLET | Freq: Every day | ORAL | 3 refills | Status: DC

## 2020-04-09 ENCOUNTER — Telehealth: Payer: Self-pay | Admitting: Family

## 2020-04-09 NOTE — Telephone Encounter (Signed)
Patient states she received a bill for 01/17/20 $35.00 it supposed to be her annual check. Please

## 2020-05-01 NOTE — Telephone Encounter (Signed)
Spoke with patient and explained she and PCP discussed things outside the CPE guidelines (depression & insomnia); therefore, $35.00 co-pay was charged for [employer].  Patient understood and said this had happened in the past and she thought she was not discussing anything this last visit that would cause this to happen again.

## 2020-05-12 ENCOUNTER — Other Ambulatory Visit: Payer: Self-pay | Admitting: Family

## 2020-05-12 MED FILL — SUMATRIPTAN SUCC 100 MG TAB: 100 | 10 days supply | Qty: 10 | Fill #1

## 2020-05-12 MED FILL — ESCITALOPRAM 20 MG TABLET: 20 | 30 days supply | Qty: 30 | Fill #2

## 2020-05-12 MED FILL — FLUTICASONE PROP 50 MCG SPR: 50 | 30 days supply | Qty: 16 | Fill #2

## 2020-05-12 MED FILL — TRI-VYLIBRA 0.18/0.215/0.25: 0.18/0.215/ | 28 days supply | Qty: 28 | Fill #2

## 2020-05-12 MED FILL — METOPROLOL SUCCINATE ER 100: 100 | 30 days supply | Qty: 30 | Fill #2

## 2020-05-12 NOTE — Telephone Encounter (Signed)
Requesting: Ambien CR 12.5mg  tablet  Contract: 01/17/2020 UDS: 01/17/2020 Last Visit: 01/17/2020 Next Visit: 07/17/2020 Last Refill: 02/21/2020 #15 and 0RF  Please Advise

## 2020-05-14 ENCOUNTER — Other Ambulatory Visit: Payer: Self-pay | Admitting: Family

## 2020-05-14 MED FILL — ZOLPIDEM TART ER 12.5 MG TA: 12.5 | 15 days supply | Qty: 15 | Fill #0

## 2020-06-18 ENCOUNTER — Other Ambulatory Visit: Payer: Self-pay | Admitting: Family

## 2020-06-18 MED FILL — METOPROLOL SUCC ER 100 MG T: 100 | 30 days supply | Qty: 30 | Fill #3

## 2020-06-18 MED FILL — TRI-VYLIBRA 0.18/0.215/0.25: 0.18/0.215/ | 28 days supply | Qty: 28 | Fill #3

## 2020-06-18 MED FILL — FLUTICASONE PROP 50 MCG SPR: 50 | 30 days supply | Qty: 16 | Fill #3

## 2020-06-18 NOTE — Telephone Encounter (Signed)
Requesting: Ambien CR 12.5mg  Contract: 01/17/2020 UDS: 01/17/2020 Last Visit: 01/17/2020 Next Visit: 07/17/20 Last Refill: 05/14/2020 #15 and 0RF  Please Advise

## 2020-06-19 MED FILL — SUMATRIPTAN SUCCINATE 100 M: 100 | 10 days supply | Qty: 10 | Fill #2

## 2020-06-20 ENCOUNTER — Other Ambulatory Visit: Payer: Self-pay | Admitting: Family

## 2020-06-20 MED FILL — ZOLPIDEM TART ER 12.5 MG TA: 12.5 | 15 days supply | Qty: 15 | Fill #0

## 2020-07-17 ENCOUNTER — Other Ambulatory Visit: Payer: Self-pay

## 2020-07-17 ENCOUNTER — Other Ambulatory Visit: Payer: Self-pay | Admitting: Family

## 2020-07-17 ENCOUNTER — Ambulatory Visit: Payer: BC Managed Care – PPO | Admitting: Family

## 2020-07-17 VITALS — BP 150/97 | HR 78 | Temp 98.5°F | Resp 16 | Wt 157.0 lb

## 2020-07-17 DIAGNOSIS — I10 Essential (primary) hypertension: Secondary | ICD-10-CM

## 2020-07-17 DIAGNOSIS — F419 Anxiety disorder, unspecified: Secondary | ICD-10-CM | POA: Diagnosis not present

## 2020-07-17 DIAGNOSIS — G43909 Migraine, unspecified, not intractable, without status migrainosus: Secondary | ICD-10-CM

## 2020-07-17 DIAGNOSIS — G4700 Insomnia, unspecified: Secondary | ICD-10-CM

## 2020-07-17 DIAGNOSIS — R300 Dysuria: Secondary | ICD-10-CM

## 2020-07-17 DIAGNOSIS — J302 Other seasonal allergic rhinitis: Secondary | ICD-10-CM

## 2020-07-17 DIAGNOSIS — F32A Depression, unspecified: Secondary | ICD-10-CM

## 2020-07-17 MED ORDER — AMLODIPINE BESYLATE 5 MG PO TABS: 5.0000 mg | ORAL_TABLET | Freq: Every day | ORAL | 3 refills | Status: DC

## 2020-07-17 MED ORDER — ESCITALOPRAM OXALATE 10 MG PO TABS
ORAL_TABLET | ORAL | 0 refills | Status: DC
Start: 2020-07-17 — End: 2020-07-17

## 2020-07-17 MED ORDER — SERTRALINE HCL 50 MG PO TABS: ORAL_TABLET | ORAL | 0 refills | Status: DC

## 2020-07-17 MED ORDER — ZOLPIDEM TARTRATE ER 12.5 MG PO TBCR: 12.5000 mg | EXTENDED_RELEASE_TABLET | Freq: Every evening | ORAL | 2 refills | Status: DC | PRN

## 2020-07-17 MED FILL — AMLODIPINE BESYLATE 5 MG TA: 5 | 30 days supply | Qty: 30 | Fill #0

## 2020-07-17 MED FILL — ESCITALOPRAM 10 MG TABLET: 10 | 14 days supply | Qty: 12 | Fill #0

## 2020-07-17 MED FILL — ZOLPIDEM TART ER 12.5 MG TA: 12.5 | 15 days supply | Qty: 15 | Fill #0

## 2020-07-17 MED FILL — SERTRALINE HCL 50 MG TABS: 50 | 30 days supply | Qty: 30 | Fill #0

## 2020-07-17 NOTE — Progress Notes (Signed)
Subjective:    Patient ID: Lindsey Burton, female    DOB: 13-Sep-1975, 45 y.o.   MRN: [id number]  HPI  Patient is a 45 yr old female who presents today for follow up.  Insomnia-  Lays down at 10-12 pm.  Uses ambien some nights. Doesn't sleep well on the nights that she does not take Ambien.  HTN-she is currently maintained on Toprol-XL 100 mg p.o. daily. BP Readings from Last 3 Encounters:  07/17/20 (!) 150/97  01/17/20 137/85  06/18/19 (!) 141/85   Seasonal allergies- taking flonase.  She continues to have allergy symptoms.  Migraines- notes usually around the time of her cycle.  She has Imitrex for as needed use.  HTN- toprol xl 100mg  once daily.    Anxiety/depression- anxiety is stable.  She feels like her depression is poorly controlled however.  She is currently on Lexapro 20 mg p.o. daily.  Review of Systems See HPI    Past Medical History:  Diagnosis Date  . Alopecia    Getting kenalog injections prn  . Anxiety   . Depression   . Hypertension   . Insomnia   . Migraines   . Migraines   . Vitamin D deficiency      Social History   Socioeconomic History  . Marital status: Married    Spouse name: Not on file  . Number of children: Not on file  . Years of education: Not on file  . Highest education level: Not on file  Occupational History  . Not on file  Tobacco Use  . Smoking status: Never Smoker  . Smokeless tobacco: Never Used  Substance and Sexual Activity  . Alcohol use: No  . Drug use: No  . Sexual activity: Not on file  Other Topics Concern  . Not on file  Social History Narrative   Daughter, age 61 autistic Does well in school   Works at 15- works part time in [employer]   Married- husband lives with her "but we are not together."    Enjoys TV   Has fish   Social Determinants of Clinical biochemist Strain: Not on file  Food Insecurity: Not on file  Transportation Needs: Not on file  Physical Activity:  Not on file  Stress: Not on file  Social Connections: Not on file  Intimate Partner Violence: Not on file    Past Surgical History:  Procedure Laterality Date  . LIPOSUCTION  09/2017    Family History  Problem Relation Age of Onset  . Hypertension Father   . Diabetes Father   . Hyperlipidemia Father   . Heart attack Father   . Diabetes Sister   . Hypertension Sister   . Cancer Neg Hx     Allergies  Allergen Reactions  . Ace Inhibitors Swelling    angioedema   . Lisinopril Swelling    Current Outpatient Medications on File Prior to Visit  Medication Sig Dispense Refill  . escitalopram (LEXAPRO) 20 MG tablet Take 1 tablet (20 mg total) by mouth daily. 90 tablet 1  . fluticasone (FLONASE) 50 MCG/ACT nasal spray Place 2 sprays into both nostrils daily. 48 g 1  . metoprolol succinate (TOPROL-XL) 100 MG 24 hr tablet Take 1 tablet (100 mg total) by mouth daily. Take with or immediately following a meal. 90 tablet 1  . Multiple Vitamins-Minerals (MULTIVITAMIN WITH MINERALS) tablet Take 1 tablet by mouth daily. 30 tablet   . Norgestimate-Ethinyl Estradiol Triphasic 0.18/0.215/0.25 MG-35  MCG tablet Take 1 tablet by mouth daily. 84 tablet 3  . SUMAtriptan (IMITREX) 100 MG tablet One tab at start of headache, may repeat 2 hours later as needed. Max 2 dose per 24 hrs 10 tablet 5  . Tazarotene (ARAZLO) 0.045 % LOTN Apply topically.    . Triamcinolone Acetonide (KENALOG IJ) Inject 60 mg as directed. As needed or Every 1 1/2 months for Alopecia    . zolpidem (AMBIEN CR) 12.5 MG CR tablet TAKE 1 TABLET BY MOUTH EVERY NIGHT AT BEDTIME AS NEEDED 15 tablet 0   No current facility-administered medications on file prior to visit.    BP (!) 150/97 (BP Location: Right Arm, Patient Position: Sitting, Cuff Size: Small)   Pulse 78   Temp 98.5 F (36.9 C) (Oral)   Resp 16   Wt 157 lb (71.2 kg)   SpO2 100%   BMI 27.81 kg/m    Objective:   Physical Exam Constitutional:      Appearance:  She is well-developed.  Neck:     Thyroid: No thyromegaly.  Cardiovascular:     Rate and Rhythm: Normal rate and regular rhythm.     Heart sounds: Normal heart sounds. No murmur heard.   Pulmonary:     Effort: Pulmonary effort is normal. No respiratory distress.     Breath sounds: Normal breath sounds. No wheezing.  Musculoskeletal:     Cervical back: Neck supple.  Skin:    General: Skin is warm and dry.  Neurological:     Mental Status: She is alert and oriented to person, place, and time.  Psychiatric:        Behavior: Behavior normal.        Thought Content: Thought content normal.        Judgment: Judgment normal.           Assessment & Plan:  Hypertension-repeat manual BP check was 170/100.  I have advised the patient that I would like to add amlodipine 5 mg p.o. daily to her regimen.  She will continue Toprol-XL 100 mg p.o. daily.  Seasonal allergies-uncontrolled.  Recommended that she add Zyrtec 10 mg p.o. daily.  Continue Flonase 2 sprays each nostril once daily.  Migraines-fair control.  Continue Imitrex as needed 50 mg.  Anxiety/depression-while she did not score well on her gad 7, she feels that her anxiety overall is okay.  She is more concerned about her depression symptoms.  She continues to work her way through a long divorce.  This is provided a great deal of stress.  I advised the patient to decrease her Lexapro to 10 mg daily for 1 week then 5 mg daily for 1 week then stop.  Simultaneously we will initiate sertraline 50 mg half tablet p.o. once daily for 1 week, then increase to a full tablet p.o. daily on week 2.  We also discussed addition of counseling.  Insomnia-uncontrolled.  I will increase the number of Ambien CR 12.5 mg tablets she gets per month to 30.  This will allow her to take it nightly.  Dysuria-notes mild dysuria.  Request urine culture.  History of hyperthyroidism-TSH was normal last visit.  Continue to monitor.  This visit occurred during  the SARS-CoV-2 public health emergency.  Safety protocols were in place, including screening questions prior to the visit, additional usage of staff PPE, and extensive cleaning of exam room while observing appropriate contact time as indicated for disinfecting solutions.

## 2020-07-17 NOTE — Patient Instructions (Addendum)
Change lexapro to 10mg  once daily for 1 week, then decrease to a 1/2 tab once daily on week two. Please start Zoloft 50mg  1/2 tab once daily for 1 week, then increase to a full tablet once daily.  Start amlodipine 5mg  once daily.

## 2020-07-18 ENCOUNTER — Other Ambulatory Visit (HOSPITAL_BASED_OUTPATIENT_CLINIC_OR_DEPARTMENT_OTHER): Payer: Self-pay

## 2020-07-18 LAB — DRUG MONITORING, PANEL 8 WITH CONFIRMATION, URINE
6 Acetylmorphine: NEGATIVE ng/mL (ref <10)
Alcohol Metabolites: NEGATIVE ng/mL
Amphetamines: NEGATIVE ng/mL (ref <500)
Benzodiazepines: NEGATIVE ng/mL (ref <100)
Buprenorphine, Urine: NEGATIVE ng/mL (ref <5)
Cocaine Metabolite: NEGATIVE ng/mL (ref <150)
Creatinine: 267.3 mg/dL
MDMA: NEGATIVE ng/mL (ref <500)
Marijuana Metabolite: NEGATIVE ng/mL (ref <20)
Opiates: NEGATIVE ng/mL (ref <100)
Oxidant: NEGATIVE ug/mL
Oxycodone: NEGATIVE ng/mL (ref <100)
pH: 6.1 (ref 4.5–9.0)

## 2020-07-18 LAB — URINE CULTURE
MICRO NUMBER:: 11721263
Result:: NO GROWTH
SPECIMEN QUALITY:: ADEQUATE

## 2020-07-18 LAB — DM TEMPLATE

## 2020-07-29 DIAGNOSIS — E1165 Type 2 diabetes mellitus with hyperglycemia: Secondary | ICD-10-CM | POA: Diagnosis not present

## 2020-07-29 DIAGNOSIS — N898 Other specified noninflammatory disorders of vagina: Secondary | ICD-10-CM | POA: Diagnosis not present

## 2020-07-29 DIAGNOSIS — Z7251 High risk heterosexual behavior: Secondary | ICD-10-CM | POA: Diagnosis not present

## 2020-07-29 DIAGNOSIS — E559 Vitamin D deficiency, unspecified: Secondary | ICD-10-CM | POA: Diagnosis not present

## 2020-07-29 DIAGNOSIS — Z79899 Other long term (current) drug therapy: Secondary | ICD-10-CM | POA: Diagnosis not present

## 2020-07-29 DIAGNOSIS — E039 Hypothyroidism, unspecified: Secondary | ICD-10-CM | POA: Diagnosis not present

## 2020-07-29 DIAGNOSIS — I10 Essential (primary) hypertension: Secondary | ICD-10-CM | POA: Diagnosis not present

## 2020-08-05 DIAGNOSIS — F32A Depression, unspecified: Secondary | ICD-10-CM | POA: Diagnosis not present

## 2020-08-05 DIAGNOSIS — R7309 Other abnormal glucose: Secondary | ICD-10-CM | POA: Diagnosis not present

## 2020-08-05 DIAGNOSIS — E559 Vitamin D deficiency, unspecified: Secondary | ICD-10-CM | POA: Diagnosis not present

## 2020-08-05 DIAGNOSIS — N898 Other specified noninflammatory disorders of vagina: Secondary | ICD-10-CM | POA: Diagnosis not present

## 2020-08-05 DIAGNOSIS — E1165 Type 2 diabetes mellitus with hyperglycemia: Secondary | ICD-10-CM | POA: Diagnosis not present

## 2020-08-18 ENCOUNTER — Ambulatory Visit: Payer: BC Managed Care – PPO | Admitting: Family

## 2020-09-09 ENCOUNTER — Other Ambulatory Visit: Payer: Self-pay | Admitting: Family

## 2020-09-09 ENCOUNTER — Other Ambulatory Visit (HOSPITAL_BASED_OUTPATIENT_CLINIC_OR_DEPARTMENT_OTHER): Payer: Self-pay

## 2020-09-09 MED FILL — Norgestimate-Eth Estrad Tab 0.18-35/0.215-35/0.25-35 MG-MCG: ORAL | 28 days supply | Qty: 28 | Fill #0 | Status: AC

## 2020-09-09 MED FILL — Metoprolol Succinate Tab ER 24HR 100 MG (Tartrate Equiv): ORAL | 30 days supply | Qty: 30 | Fill #0 | Status: AC

## 2020-09-09 MED FILL — Amlodipine Besylate Tab 5 MG (Base Equivalent): ORAL | 30 days supply | Qty: 30 | Fill #0 | Status: AC

## 2020-09-09 MED FILL — Sertraline HCl Tab 50 MG: ORAL | 15 days supply | Qty: 15 | Fill #0 | Status: AC

## 2020-09-09 MED FILL — Fluticasone Propionate Nasal Susp 50 MCG/ACT: NASAL | 30 days supply | Qty: 16 | Fill #0 | Status: AC

## 2020-09-09 MED FILL — Sumatriptan Succinate Tab 100 MG: ORAL | 10 days supply | Qty: 10 | Fill #0 | Status: AC

## 2020-09-09 MED FILL — Zolpidem Tartrate Tab ER 12.5 MG: ORAL | 25 days supply | Qty: 15 | Fill #0 | Status: AC

## 2020-10-12 ENCOUNTER — Other Ambulatory Visit (HOSPITAL_BASED_OUTPATIENT_CLINIC_OR_DEPARTMENT_OTHER): Payer: Self-pay

## 2020-10-12 ENCOUNTER — Other Ambulatory Visit: Payer: Self-pay | Admitting: Family

## 2020-10-12 ENCOUNTER — Telehealth: Payer: Self-pay | Admitting: Family

## 2020-10-12 MED FILL — Zolpidem Tartrate Tab ER 12.5 MG: ORAL | 25 days supply | Qty: 15 | Fill #1 | Status: AC

## 2020-10-12 MED FILL — Fluticasone Propionate Nasal Susp 50 MCG/ACT: NASAL | 30 days supply | Qty: 16 | Fill #1 | Status: AC

## 2020-10-12 MED FILL — Norgestimate-Eth Estrad Tab 0.18-35/0.215-35/0.25-35 MG-MCG: ORAL | 28 days supply | Qty: 28 | Fill #1 | Status: AC

## 2020-10-12 MED FILL — Metoprolol Succinate Tab ER 24HR 100 MG (Tartrate Equiv): ORAL | 30 days supply | Qty: 30 | Fill #1 | Status: AC

## 2020-10-12 MED FILL — Sumatriptan Succinate Tab 100 MG: ORAL | 9 days supply | Qty: 9 | Fill #1 | Status: AC

## 2020-10-12 MED FILL — Amlodipine Besylate Tab 5 MG (Base Equivalent): ORAL | 30 days supply | Qty: 30 | Fill #1 | Status: AC

## 2020-10-12 NOTE — Telephone Encounter (Signed)
Please contact pt to schedule a follow up with me. Virtual is OK.  She needs OV prior to additional refills of zoloft.

## 2020-10-13 ENCOUNTER — Other Ambulatory Visit (HOSPITAL_BASED_OUTPATIENT_CLINIC_OR_DEPARTMENT_OTHER): Payer: Self-pay

## 2020-10-14 NOTE — Telephone Encounter (Signed)
Called but no answer, lvm for patient to call back 

## 2020-12-01 ENCOUNTER — Other Ambulatory Visit (HOSPITAL_BASED_OUTPATIENT_CLINIC_OR_DEPARTMENT_OTHER): Payer: Self-pay

## 2020-12-01 MED FILL — Amlodipine Besylate Tab 5 MG (Base Equivalent): ORAL | 30 days supply | Qty: 30 | Fill #2 | Status: AC

## 2020-12-01 MED FILL — Zolpidem Tartrate Tab ER 12.5 MG: ORAL | 25 days supply | Qty: 15 | Fill #2 | Status: AC

## 2020-12-01 MED FILL — Norgestimate-Eth Estrad Tab 0.18-35/0.215-35/0.25-35 MG-MCG: ORAL | 28 days supply | Qty: 28 | Fill #2 | Status: AC

## 2021-01-12 ENCOUNTER — Other Ambulatory Visit: Payer: Self-pay | Admitting: Family

## 2021-01-12 ENCOUNTER — Other Ambulatory Visit (HOSPITAL_BASED_OUTPATIENT_CLINIC_OR_DEPARTMENT_OTHER): Payer: Self-pay

## 2021-01-12 MED ORDER — AMLODIPINE BESYLATE 5 MG PO TABS
ORAL_TABLET | Freq: Every day | ORAL | 0 refills | Status: DC
  Filled 2021-01-12: qty 30, 30d supply, fill #0

## 2021-01-12 MED ORDER — FLUTICASONE PROPIONATE 50 MCG/ACT NA SUSP
2.0000 | Freq: Every day | NASAL | 0 refills | Status: DC
  Filled 2021-01-12: qty 16, 30d supply, fill #0

## 2021-01-12 MED ORDER — SUMATRIPTAN SUCCINATE 100 MG PO TABS
ORAL_TABLET | ORAL | 0 refills | Status: DC
  Filled 2021-01-12: qty 10, 5d supply, fill #0

## 2021-01-12 MED ORDER — METOPROLOL SUCCINATE ER 100 MG PO TB24
ORAL_TABLET | ORAL | 0 refills | Status: DC
  Filled 2021-01-12: qty 30, 30d supply, fill #0

## 2021-01-12 MED FILL — Norgestimate-Eth Estrad Tab 0.18-35/0.215-35/0.25-35 MG-MCG: ORAL | 28 days supply | Qty: 28 | Fill #3 | Status: AC

## 2021-01-12 MED FILL — Zolpidem Tartrate Tab ER 12.5 MG: ORAL | 25 days supply | Qty: 15 | Fill #3 | Status: AC

## 2021-01-20 ENCOUNTER — Other Ambulatory Visit (HOSPITAL_BASED_OUTPATIENT_CLINIC_OR_DEPARTMENT_OTHER): Payer: Self-pay

## 2021-02-20 NOTE — Progress Notes (Addendum)
[facility] [address], [place], [place] [address] [phone]  Date:  02/22/2021   Name:  Lindsey Burton   DOB:  [birth year]   MRN:  [id number]  PCP:  [name], NP    Chief Complaint: No chief complaint on file.   History of Present Illness:  Lindsey Burton is a 45 y.o. very pleasant female patient who presents with the following:  Pt seen today with concern of UTI- I have not seen her myself in the past  History of HTN, allergies  Pt of [name]- last seen in April with concern of UTI but culture was negative at that time   She notes that about a week ago she had urinary frequency- and feeling like she she had to urinate but no output No pain, she has her menses currently so she is not sure about hematuria The urinary sx have eased off the last day or so-she thinks her infection may have cleared on its own She is using cranberry juice at home  No vaginal symptoms noted  She has been under more stress recently which is why she thinks her BP is up-she generally takes her metoprolol and amlodipine in the morning.  She has been consistent with her medication use She is otherwise feeling well  BP Readings from Last 3 Encounters:  02/22/21 (!) 177/100  07/17/20 (!) 150/97  01/17/20 137/85   Has her menses now   Patient Active Problem List   Diagnosis Date Noted   Alopecia 02/18/2013   Migraines 02/18/2013   Anxiety and depression 02/18/2013   Routine general medical examination at a health care facility 02/18/2013   History of hyperthyroidism 03/09/2010   Essential hypertension 12/26/2008   Allergic rhinitis 12/26/2008    Past Medical History:  Diagnosis Date   Alopecia    Getting kenalog injections prn   Anxiety    Depression    Hypertension    Insomnia    Migraines    Migraines    Vitamin D deficiency     Past Surgical History:  Procedure Laterality Date    LIPOSUCTION  09/2017    Social History   Tobacco Use   Smoking status: Never   Smokeless tobacco: Never  Substance Use Topics   Alcohol use: No   Drug use: No    Family History  Problem Relation Age of Onset   Hypertension Father    Diabetes Father    Hyperlipidemia Father    Heart attack Father    Diabetes Sister    Hypertension Sister    Cancer Neg Hx     Allergies  Allergen Reactions   Ace Inhibitors Swelling    angioedema    Lisinopril Swelling    Medication list has been reviewed and updated.  Current Outpatient Medications on File Prior to Visit  Medication Sig Dispense Refill   amLODipine (NORVASC) 5 MG tablet TAKE 1 TABLET (5 MG TOTAL) BY MOUTH DAILY. 30 tablet 0   escitalopram (LEXAPRO) 10 MG tablet TAKE 1 TABLET BY MOUTH ONCE DAILY FOR 1 WEEK, THEN 1/2 TAB ONCE DAILY FOR 1 WEEK THEN STOP 12 tablet 0   fluticasone (FLONASE) 50 MCG/ACT nasal spray PLACE 2 SPRAYS INTO BOTH NOSTRILS DAILY 16 g 0   metoprolol succinate (TOPROL-XL) 100 MG 24 hr tablet TAKE 1 TABLET BY MOUTH DAILY WITH OR IMMEDIATELY FOLLOWING A MEAL 30 tablet 0   Multiple Vitamins-Minerals (MULTIVITAMIN WITH MINERALS) tablet  Take 1 tablet by mouth daily. 30 tablet    Norgestimate-Ethinyl Estradiol Triphasic 0.18/0.215/0.25 MG-35 MCG tablet Take 1 tablet by mouth daily. 84 tablet 3   Norgestimate-Ethinyl Estradiol Triphasic 0.18/0.215/0.25 MG-35 MCG tablet TAKE 1 TABLET BY MOUTH DAILY. 84 tablet 4   sertraline (ZOLOFT) 50 MG tablet Take 1 tablet (50 mg total) by mouth daily. 15 tablet 0   SUMAtriptan (IMITREX) 100 MG tablet TAKE ONE TABLET BY MOUTH AT START OF HEADACHE, MAY REPEAT 2 HOURS LATER AS NEEDED. MAX 2 DOSE PER 24 HOURS 10 tablet 0   Tazarotene (ARAZLO) 0.045 % LOTN Apply topically.     Triamcinolone Acetonide (KENALOG IJ) Inject 60 mg as directed. As needed or Every 1 1/2 months for Alopecia     zolpidem (AMBIEN CR) 12.5 MG CR tablet TAKE 1 TABLET (12.5 MG TOTAL) BY MOUTH AT BEDTIME AS  NEEDED. 30 tablet 2   No current facility-administered medications on file prior to visit.    Review of Systems:  As per HPI- otherwise negative.   Physical Examination: Vitals:   02/22/21 1621  BP: (!) 177/100  Pulse: 87  Resp: 16  Temp: 98.7 F (37.1 C)  SpO2: 100%   Vitals:   02/22/21 1621  Weight: 163 lb (73.9 kg)   Body mass index is 28.87 kg/m. Ideal Body Weight:    GEN: no acute distress.  Overweight, looks well HEENT: Atraumatic, Normocephalic.  Ears and Nose: No external deformity. CV: RRR, No M/G/R. No JVD. No thrill. No extra heart sounds. PULM: CTA B, no wheezes, crackles, rhonchi. No retractions. No resp. distress. No accessory muscle use. ABD: S, NT, ND, +BS. No rebound. No HSM.  Belly is benign, no CVA tenderness EXTR: No c/c/e PSYCH: Normally interactive. Conversant.  Blood pressure on recheck approximately 160/90 Results for orders placed or performed in visit on 02/22/21  POCT urinalysis dipstick  Result Value Ref Range   Color, UA yellow yellow   Clarity, UA clear clear   Glucose, UA negative negative mg/dL   Bilirubin, UA negative negative   Ketones, POC UA negative negative mg/dL   Spec Grav, UA 4.496 7.591 - 1.025   Blood, UA negative negative   pH, UA 6.0 5.0 - 8.0   Protein Ur, POC trace (A) negative mg/dL   Urobilinogen, UA 0.2 0.2 or 1.0 E.U./dL   Nitrite, UA Negative Negative   Leukocytes, UA Negative Negative    Assessment and Plan: Dysuria - Plan: Urine Culture, POCT urinalysis dipstick  Primary hypertension  Patient seen today with concern of possible UTI symptoms.  The symptoms actually improved over the last couple of days.  Her urine dip is relatively benign, await urine culture  Discussed her blood pressure, it is quite elevated today.  Patient notes she has been under a lot of stress due to a dispute with her ex.  I advised her to take another 5 mg amlodipine tonight, and then continue taking amlodipine twice a day as  well as her metoprolol for the next several days.  She will check her blood pressure at home and let me know how it responds  Signed Abbe Amsterdam, MD  Addendum 11/9- Received urine culture as below.  Message to patient  Results for orders placed or performed in visit on 02/22/21  Urine Culture   Specimen: Urine  Result Value Ref Range   MICRO NUMBER: [id number]    SPECIMEN QUALITY: Adequate    Sample Source URINE    STATUS: FINAL  Result: No Growth   POCT urinalysis dipstick  Result Value Ref Range   Color, UA yellow yellow   Clarity, UA clear clear   Glucose, UA negative negative mg/dL   Bilirubin, UA negative negative   Ketones, POC UA negative negative mg/dL   Spec Grav, UA 1.020 1.010 - 1.025   Blood, UA negative negative   pH, UA 6.0 5.0 - 8.0   Protein Ur, POC trace (A) negative mg/dL   Urobilinogen, UA 0.2 0.2 or 1.0 E.U./dL   Nitrite, UA Negative Negative   Leukocytes, UA Negative Negative

## 2021-02-22 ENCOUNTER — Ambulatory Visit: Payer: BC Managed Care – PPO | Admitting: Family Medicine

## 2021-02-22 ENCOUNTER — Other Ambulatory Visit: Payer: Self-pay

## 2021-02-22 VITALS — BP 177/100 | HR 87 | Temp 98.7°F | Resp 16 | Wt 163.0 lb

## 2021-02-22 DIAGNOSIS — I10 Essential (primary) hypertension: Secondary | ICD-10-CM | POA: Diagnosis not present

## 2021-02-22 DIAGNOSIS — R300 Dysuria: Secondary | ICD-10-CM

## 2021-02-22 LAB — POCT URINALYSIS DIP (MANUAL ENTRY)
Bilirubin, UA: NEGATIVE
Blood, UA: NEGATIVE
Glucose, UA: NEGATIVE mg/dL
Ketones, POC UA: NEGATIVE mg/dL
Leukocytes, UA: NEGATIVE
Nitrite, UA: NEGATIVE
Spec Grav, UA: 1.02 (ref 1.010–1.025)
Urobilinogen, UA: 0.2 E.U./dL
pH, UA: 6 (ref 5.0–8.0)

## 2021-02-22 NOTE — Patient Instructions (Signed)
Good to see you today- I will be in touch with your urine culture Let's have you take 10 mg total of amlodipine for a few days and check your BP at home.  Please let me know how it looks via my chart

## 2021-02-23 LAB — URINE CULTURE
MICRO NUMBER:: 12602737
Result:: NO GROWTH
SPECIMEN QUALITY:: ADEQUATE

## 2021-02-24 ENCOUNTER — Encounter: Payer: Self-pay | Admitting: Family Medicine

## 2021-02-24 ENCOUNTER — Other Ambulatory Visit: Payer: Self-pay | Admitting: Family

## 2021-02-25 ENCOUNTER — Other Ambulatory Visit (HOSPITAL_BASED_OUTPATIENT_CLINIC_OR_DEPARTMENT_OTHER): Payer: Self-pay

## 2021-02-25 MED ORDER — ZOLPIDEM TARTRATE ER 12.5 MG PO TBCR: EXTENDED_RELEASE_TABLET | ORAL | 0 refills | Status: DC

## 2021-02-25 MED ORDER — NORGESTIM-ETH ESTRAD TRIPHASIC 0.18/0.215/0.25 MG-35 MCG PO TABS
1.0000 | ORAL_TABLET | Freq: Every day | ORAL | 0 refills | Status: DC
  Filled 2021-02-25: qty 28, 28d supply, fill #0

## 2021-02-25 NOTE — Addendum Note (Signed)
Addended by: Alysia Penna on: 02/25/2021 10:28 AM   Modules accepted: Orders

## 2021-03-02 ENCOUNTER — Other Ambulatory Visit: Payer: Self-pay | Admitting: Family

## 2021-03-02 ENCOUNTER — Other Ambulatory Visit (HOSPITAL_BASED_OUTPATIENT_CLINIC_OR_DEPARTMENT_OTHER): Payer: Self-pay

## 2021-03-02 MED ORDER — ZOLPIDEM TARTRATE ER 12.5 MG PO TBCR
EXTENDED_RELEASE_TABLET | ORAL | 0 refills | Status: DC
  Filled 2021-03-02: qty 15, 25d supply, fill #0

## 2021-03-05 ENCOUNTER — Telehealth: Payer: Self-pay | Admitting: Family

## 2021-03-05 NOTE — Telephone Encounter (Signed)
Patient wants a call back to talk about switching her birth control to Neplanon, and to know if they can do it in this office. Please advice.

## 2021-03-05 NOTE — Telephone Encounter (Signed)
Pt has appointment scheduled with  [facility] on 11/28.

## 2021-03-15 ENCOUNTER — Other Ambulatory Visit: Payer: Self-pay

## 2021-03-15 ENCOUNTER — Encounter: Payer: Self-pay | Admitting: Family Medicine

## 2021-03-15 ENCOUNTER — Ambulatory Visit: Payer: BC Managed Care – PPO | Admitting: Family Medicine

## 2021-03-15 VITALS — BP 138/88 | HR 75 | Temp 98.5°F | Ht 62.0 in | Wt 166.0 lb

## 2021-03-15 DIAGNOSIS — Z3009 Encounter for other general counseling and advice on contraception: Secondary | ICD-10-CM | POA: Diagnosis not present

## 2021-03-15 DIAGNOSIS — Z30017 Encounter for initial prescription of implantable subdermal contraceptive: Secondary | ICD-10-CM

## 2021-03-15 MED ORDER — ETONOGESTREL 68 MG ~~LOC~~ IMPL
68.0000 mg | DRUG_IMPLANT | Freq: Once | SUBCUTANEOUS | Status: AC
Start: 2021-03-15 — End: 2021-03-15
  Administered 2021-03-15: 16:00:00 68 mg via SUBCUTANEOUS

## 2021-03-15 NOTE — Progress Notes (Signed)
CC: Birth control counseling   Subjective: Patient is a 45 y.o. female here for [facility] discussion.  Patient broke up with her husband had a vasectomy.  She went back on oral contraceptive pills.  She has been having issues with acne and is interested in switching to another method.  With her regular PCP, she had discussed Nexplanon placement.  She is here for that discussion.  She has not been sexually active.  She has cycles lasting 7 days fairly routinely.  Mom went through menopause in early 69s.  Past Medical History:  Diagnosis Date   Alopecia    Getting kenalog injections prn   Anxiety    Depression    Hypertension    Insomnia    Migraines    Migraines    Vitamin D deficiency     Objective: BP 138/88   Pulse 75   Temp 98.5 F (36.9 C) (Oral)   Ht 5\' 2"  (1.575 m)   Wt 166 lb (75.3 kg)   SpO2 99%   BMI 30.36 kg/m  General: Awake, appears stated age Lungs:  No accessory muscle use Psych: Age appropriate judgment and insight, normal affect and mood  Procedure Note, Nexplanon placement: Informed consent obtained. The patient was placed in a comfortable supine position and the non-dominant arm, left, was positioned to access the sulcus between the bicep and triceps muscles.  Measurements were made, and markings were placed at 8 cm, 10 cm, 12 cm and 14 cm from the medial epicondyle of the humerus.  The area was prepped with alcohol and a 27 gauge needle was used to inject 3 mL of 1% lidocaine with epinephrine.  The area was then cleaned with betadine. Sterile gloves were then utilized. The Nexplanon trocar was then inserted at the end of anesthetized area and pushed gently through the subcutaneous tissue along the line of the sulcus.  The seal was broken and the implant was held in place while the trocar was withdrawn.  The implant was then palpated by both the patient and me.  The arm was cleansed and the puncture site from the trocar covered with triple antibiotic ointment  and pressure dressing.  Hemostasis was observed at the site. There were no complications noted.  The patient tolerated the procedure well.  She was instructed that the device must be removed in three years.  Assessment and Plan: Birth control counseling - Plan: etonogestrel (NEXPLANON) implant 68 mg  Urine preg neg. Changing from OCP to Nexplanon. Aftercare instructions verbalized and written down. Do not shower for the rest of the day. When you do wash it, use only soap and water. Do not vigorously scrub. Apply triple antibiotic ointment (like Neosporin) twice daily. Keep the area clean and dry.   Things to look out for: increasing pain not relieved by ibuprofen/acetaminophen, fevers, spreading redness, drainage of pus, or foul odor. Pt palpated in add'n to me. Remove in 3 years.  The patient voiced understanding and agreement to the plan.  Mason, DO 03/15/21  3:53 PM

## 2021-03-15 NOTE — Patient Instructions (Signed)
Do not shower for the rest of the day. When you do wash it, use only soap and water. Do not vigorously scrub. Apply triple antibiotic ointment (like Neosporin) twice daily. Keep the area clean and dry.   Things to look out for: increasing pain not relieved by ibuprofen/acetaminophen, fevers, spreading redness, drainage of pus, or foul odor.  Use back up contraception for 7 days.   Let us know if you need anything.

## 2021-03-15 NOTE — Addendum Note (Signed)
Addended by: Scharlene Gloss B on: 03/15/2021 05:20 PM   Modules accepted: Orders

## 2021-03-15 NOTE — Addendum Note (Signed)
Addended by: Scharlene Gloss B on: 03/15/2021 05:18 PM   Modules accepted: Orders

## 2021-04-28 ENCOUNTER — Other Ambulatory Visit (HOSPITAL_BASED_OUTPATIENT_CLINIC_OR_DEPARTMENT_OTHER): Payer: Self-pay

## 2021-04-28 ENCOUNTER — Ambulatory Visit (INDEPENDENT_AMBULATORY_CARE_PROVIDER_SITE_OTHER): Payer: 59 | Admitting: Family

## 2021-04-28 VITALS — BP 146/93 | HR 108 | Temp 98.8°F | Resp 16 | Wt 164.0 lb

## 2021-04-28 DIAGNOSIS — G4700 Insomnia, unspecified: Secondary | ICD-10-CM

## 2021-04-28 DIAGNOSIS — I10 Essential (primary) hypertension: Secondary | ICD-10-CM

## 2021-04-28 DIAGNOSIS — L723 Sebaceous cyst: Secondary | ICD-10-CM

## 2021-04-28 LAB — COMPREHENSIVE METABOLIC PANEL
ALT: 11 U/L (ref 0–35)
AST: 14 U/L (ref 0–37)
Albumin: 4.4 g/dL (ref 3.5–5.2)
Alkaline Phosphatase: 116 U/L (ref 39–117)
BUN: 12 mg/dL (ref 6–23)
CO2: 29 mEq/L (ref 19–32)
Calcium: 9.2 mg/dL (ref 8.4–10.5)
Chloride: 103 mEq/L (ref 96–112)
Creatinine, Ser: 0.77 mg/dL (ref 0.40–1.20)
GFR: 91.81 mL/min (ref >60.00)
Glucose, Bld: 129 mg/dL — ABNORMAL HIGH (ref 70–99)
Potassium: 4 mEq/L (ref 3.5–5.1)
Sodium: 139 mEq/L (ref 135–145)
Total Bilirubin: 0.5 mg/dL (ref 0.2–1.2)
Total Protein: 7.3 g/dL (ref 6.0–8.3)

## 2021-04-28 MED ORDER — SUMATRIPTAN SUCCINATE 100 MG PO TABS
ORAL_TABLET | ORAL | 5 refills | Status: DC
  Filled 2021-04-28: qty 10, 20d supply, fill #0
  Filled 2022-03-18: qty 9, 30d supply, fill #1

## 2021-04-28 MED ORDER — AMLODIPINE BESYLATE 10 MG PO TABS
10.0000 mg | ORAL_TABLET | Freq: Every day | ORAL | 1 refills | Status: DC
  Filled 2021-04-28: qty 90, 90d supply, fill #0
  Filled 2021-07-27: qty 90, 90d supply, fill #1

## 2021-04-28 MED ORDER — ESCITALOPRAM OXALATE 20 MG PO TABS
20.0000 mg | ORAL_TABLET | Freq: Every day | ORAL | 1 refills | Status: DC
  Filled 2021-04-28: qty 90, 90d supply, fill #0
  Filled 2021-07-27: qty 90, 90d supply, fill #1

## 2021-04-28 MED ORDER — METOPROLOL SUCCINATE ER 100 MG PO TB24
ORAL_TABLET | ORAL | 1 refills | Status: DC
  Filled 2021-04-28: qty 90, 90d supply, fill #0
  Filled 2021-07-27: qty 90, 90d supply, fill #1

## 2021-04-28 NOTE — Patient Instructions (Signed)
Please increase amlodipine from 5mg to 10mg once daily.  

## 2021-04-28 NOTE — Progress Notes (Signed)
Subjective:   By signing my name below, I, [name], attest that this documentation has been prepared under the direction and in the presence of Sandford Craze NP. 04/28/2021      Patient ID: Lindsey Burton, female    DOB: 09/21/1975, 46 y.o.   MRN: [id number]  Chief Complaint  Patient presents with   Abscess    "Small abscess like area on left groin area"    Abscess  Patient is in today for a office visit.  Razor bump- She complains of razor bump on her left groin for the past couple of weeks. She uses a trimer to shave and is not sure if it is an infected hair.  Blood pressure- Her blood pressure is elevated during this visit. She continues taking 5 mg amlodipine daily PO, 100 mg metoprolol succinate daily PO and reports no new issues while taking it. She is requesting a refill on 100 mg metoprolol succinate daily PO.  BP Readings from Last 3 Encounters:  04/28/21 (!) 146/93  03/15/21 138/88  02/22/21 (!) 177/100   Pulse Readings from Last 3 Encounters:  04/28/21 (!) 108  03/15/21 75  02/22/21 87   Migraines- She reports her migraines have recently increased in frequency. Her migraines typically start while sleeping and she feels them after waking up. She takes 100 mg Imitrex to manage her symptoms.  Mood- She continues taking 20 mg lexapro daily PO and reports doing well while taking it. She is requesting a refill on it as well. She is no longer taking Zoloft. Sleep- She continues taking 12.5 mg CR Ambien daily PO and reports no new issues while taking it. She finds the Ambien helps her stay asleep rather than fall asleep.  Birth control- She has an IUD placed and is no longer taking birth control medication.  Immunizations- She does not have the flu vaccine this year.    Health Maintenance Due  Topic Date Due   Pneumococcal Vaccine 2-52 Years old (1 - PCV) Never done   URINE MICROALBUMIN  Never done   Hepatitis C Screening  Never done   COLONOSCOPY (Pts  45-42yrs Insurance coverage will need to be confirmed)  Never done   COVID-19 Vaccine (3 - Booster for [employer] series) 09/21/2019   INFLUENZA VACCINE  11/16/2020    Past Medical History:  Diagnosis Date   Alopecia    Getting kenalog injections prn   Anxiety    Depression    Hypertension    Insomnia    Migraines    Migraines    Vitamin D deficiency     Past Surgical History:  Procedure Laterality Date   LIPOSUCTION  09/2017    Family History  Problem Relation Age of Onset   Hypertension Father    Diabetes Father    Hyperlipidemia Father    Heart attack Father    Diabetes Sister    Hypertension Sister    Cancer Neg Hx     Social History   Socioeconomic History   Marital status: Married    Spouse name: Not on file   Number of children: Not on file   Years of education: Not on file   Highest education level: Not on file  Occupational History   Not on file  Tobacco Use   Smoking status: Never   Smokeless tobacco: Never  Substance and Sexual Activity   Alcohol use: No   Drug use: No   Sexual activity: Not on file  Other Topics Concern  Not on file  Social History Narrative   Daughter, age 61 autistic Does well in school   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, art   Married- husband lives with her "but we are not together."    Enjoys TV   Has fish   Social Determinants of Radio broadcast assistant Strain: Not on file  Food Insecurity: Not on file  Transportation Needs: Not on file  Physical Activity: Not on file  Stress: Not on file  Social Connections: Not on file  Intimate Partner Violence: Not on file    Outpatient Medications Prior to Visit  Medication Sig Dispense Refill   fluticasone (FLONASE) 50 MCG/ACT nasal spray PLACE 2 SPRAYS INTO BOTH NOSTRILS DAILY 16 g 0   Multiple Vitamins-Minerals (MULTIVITAMIN WITH MINERALS) tablet Take 1 tablet by mouth daily. 30 tablet    Tazarotene (ARAZLO) 0.045 % LOTN Apply topically.      Triamcinolone Acetonide (KENALOG IJ) Inject 60 mg as directed. As needed or Every 1 1/2 months for Alopecia     zolpidem (AMBIEN CR) 12.5 MG CR tablet Take 1 tablet by mouth at bedtime as needed 30 tablet 0   amLODipine (NORVASC) 5 MG tablet TAKE 1 TABLET (5 MG TOTAL) BY MOUTH DAILY. 30 tablet 0   escitalopram (LEXAPRO) 10 MG tablet TAKE 1 TABLET BY MOUTH ONCE DAILY FOR 1 WEEK, THEN 1/2 TAB ONCE DAILY FOR 1 WEEK THEN STOP 12 tablet 0   metoprolol succinate (TOPROL-XL) 100 MG 24 hr tablet TAKE 1 TABLET BY MOUTH DAILY WITH OR IMMEDIATELY FOLLOWING A MEAL 30 tablet 0   Norgestimate-Ethinyl Estradiol Triphasic (TRI-VYLIBRA) 0.18/0.215/0.25 MG-35 MCG tablet TAKE 1 TABLET BY MOUTH DAILY. 84 tablet 0   Norgestimate-Ethinyl Estradiol Triphasic 0.18/0.215/0.25 MG-35 MCG tablet Take 1 tablet by mouth daily. 84 tablet 3   sertraline (ZOLOFT) 50 MG tablet Take 1 tablet (50 mg total) by mouth daily. 15 tablet 0   SUMAtriptan (IMITREX) 100 MG tablet TAKE ONE TABLET BY MOUTH AT START OF HEADACHE, MAY REPEAT 2 HOURS LATER AS NEEDED. MAX 2 DOSE PER 24 HOURS 10 tablet 0   zolpidem (AMBIEN CR) 12.5 MG CR tablet TAKE 1 TABLET (12.5 MG TOTAL) BY MOUTH AT BEDTIME AS NEEDED. 30 tablet 0   No facility-administered medications prior to visit.    Allergies  Allergen Reactions   Ace Inhibitors Swelling    angioedema    Lisinopril Swelling    Review of Systems  Skin:        (+)razor bump on left groin      Objective:    Physical Exam Constitutional:      General: She is not in acute distress.    Appearance: Normal appearance. She is not ill-appearing.  HENT:     Head: Normocephalic and atraumatic.     Right Ear: External ear normal.     Left Ear: External ear normal.  Eyes:     Extraocular Movements: Extraocular movements intact.     Pupils: Pupils are equal, round, and reactive to light.  Cardiovascular:     Rate and Rhythm: Normal rate and regular rhythm.     Heart sounds: Normal heart sounds. No  murmur heard.   No gallop.     Comments: Her blood pressure measured 165/102 during recheck Pulmonary:     Effort: Pulmonary effort is normal. No respiratory distress.     Breath sounds: Normal breath sounds. No wheezing or rales.  Skin:    General:  Skin is warm and dry.     Comments: Small sebaceous cyst noted on left labia  Neurological:     Mental Status: She is alert and oriented to person, place, and time.  Psychiatric:        Behavior: Behavior normal.    BP (!) 146/93 (BP Location: Right Arm, Patient Position: Sitting, Cuff Size: Small)    Pulse (!) 108    Temp 98.8 F (37.1 C) (Oral)    Resp 16    Wt 164 lb (74.4 kg)    SpO2 100%    BMI 30.00 kg/m  Wt Readings from Last 3 Encounters:  04/28/21 164 lb (74.4 kg)  03/15/21 166 lb (75.3 kg)  02/22/21 163 lb (73.9 kg)       Assessment & Plan:   Problem List Items Addressed This Visit       Unprioritized   Sebaceous cyst    Small, non-tender. No sign of infection.  Reassurance provided.       Insomnia - Primary    Stable with prn use of ambien CR. Continue same. UDS is updated today as well as controlled substance contract.       Relevant Orders   DRUG MONITORING, PANEL 8 WITH CONFIRMATION, URINE   Essential hypertension    BP Readings from Last 3 Encounters:  04/28/21 (!) 146/93  03/15/21 138/88  02/22/21 (!) 177/100  Uncontrolled.  Increase amlodipine from $RemoveBeforeD'5mg'YNhaBXlUYcPwDw$  to $R'10mg'Nm$  once daily.       Relevant Medications   amLODipine (NORVASC) 10 MG tablet   metoprolol succinate (TOPROL-XL) 100 MG 24 hr tablet   Other Visit Diagnoses     Primary hypertension       Relevant Medications   amLODipine (NORVASC) 10 MG tablet   metoprolol succinate (TOPROL-XL) 100 MG 24 hr tablet   Other Relevant Orders   Comp Met (CMET) (Completed)        Meds ordered this encounter  Medications   amLODipine (NORVASC) 10 MG tablet    Sig: Take 1 tablet (10 mg total) by mouth daily.    Dispense:  90 tablet    Refill:  1    Order  Specific Question:   Supervising Provider    Answer:   Penni Homans A [[id number]]   escitalopram (LEXAPRO) 20 MG tablet    Sig: Take 1 tablet (20 mg total) by mouth daily.    Dispense:  90 tablet    Refill:  1    Order Specific Question:   Supervising Provider    Answer:   Penni Homans A [[id number]]   metoprolol succinate (TOPROL-XL) 100 MG 24 hr tablet    Sig: TAKE 1 TABLET BY MOUTH DAILY WITH OR IMMEDIATELY FOLLOWING A MEAL    Dispense:  90 tablet    Refill:  1    Requested drug refills are authorized, however, the patient needs further evaluation and/or laboratory testing before further refills are given. Ask her to make an appointment for this.    Order Specific Question:   Supervising Provider    Answer:   Penni Homans A [[id number]]   SUMAtriptan (IMITREX) 100 MG tablet    Sig: TAKE ONE TABLET BY MOUTH AT START OF HEADACHE, MAY REPEAT 2 HOURS LATER AS NEEDED. MAX 2 DOSE PER 24 HOURS    Dispense:  10 tablet    Refill:  5    Requested drug refills are authorized, however, the patient needs further evaluation and/or laboratory testing before further refills are given. Ask  her to make an appointment for this.    Order Specific Question:   Supervising Provider    Answer:   Mosie Lukes [[id number]]    I, [name] NP, personally preformed the services described in this documentation.  All medical record entries made by the scribe were at my direction and in my presence.  I have reviewed the chart and discharge instructions (if applicable) and agree that the record reflects my personal performance and is accurate and complete. 04/28/2020   I,[name],acting as a Education administrator for Nance Pear, NP.,have documented all relevant documentation on the behalf of Nance Pear, NP,as directed by  Nance Pear, NP while in the presence of Nance Pear, NP.   Nance Pear, NP

## 2021-04-29 ENCOUNTER — Telehealth: Payer: Self-pay | Admitting: Family

## 2021-04-29 DIAGNOSIS — G4700 Insomnia, unspecified: Secondary | ICD-10-CM | POA: Insufficient documentation

## 2021-04-29 DIAGNOSIS — L723 Sebaceous cyst: Secondary | ICD-10-CM | POA: Insufficient documentation

## 2021-04-29 NOTE — Assessment & Plan Note (Signed)
BP Readings from Last 3 Encounters:  04/28/21 (!) 146/93  03/15/21 138/88  02/22/21 (!) 177/100   Uncontrolled.  Increase amlodipine from 5mg  to 10mg  once daily.

## 2021-04-29 NOTE — Telephone Encounter (Signed)
Sugar is elevated.  I would like for her to return for A1C dx hyperglycemia please.

## 2021-04-29 NOTE — Assessment & Plan Note (Signed)
Small, non-tender. No sign of infection.  Reassurance provided.

## 2021-04-29 NOTE — Assessment & Plan Note (Signed)
Stable with prn use of ambien CR. Continue same. UDS is updated today as well as controlled substance contract.

## 2021-04-30 ENCOUNTER — Other Ambulatory Visit: Payer: Self-pay

## 2021-04-30 DIAGNOSIS — R739 Hyperglycemia, unspecified: Secondary | ICD-10-CM

## 2021-04-30 LAB — DRUG MONITORING, PANEL 8 WITH CONFIRMATION, URINE
6 Acetylmorphine: NEGATIVE ng/mL (ref <10)
Alcohol Metabolites: NEGATIVE ng/mL (ref <500)
Amphetamines: NEGATIVE ng/mL (ref <500)
Benzodiazepines: NEGATIVE ng/mL (ref <100)
Buprenorphine, Urine: NEGATIVE ng/mL (ref <5)
Cocaine Metabolite: NEGATIVE ng/mL (ref <150)
Creatinine: 191.5 mg/dL (ref >=20.0)
MDMA: NEGATIVE ng/mL (ref <500)
Marijuana Metabolite: NEGATIVE ng/mL (ref <20)
Opiates: NEGATIVE ng/mL (ref <100)
Oxidant: NEGATIVE ug/mL (ref <200)
Oxycodone: NEGATIVE ng/mL (ref <100)
pH: 7.2 (ref 4.5–9.0)

## 2021-04-30 LAB — DM TEMPLATE

## 2021-04-30 NOTE — Telephone Encounter (Signed)
Patient advised of results and scheduled to come in for A1C 05/05/21

## 2021-05-05 ENCOUNTER — Other Ambulatory Visit (INDEPENDENT_AMBULATORY_CARE_PROVIDER_SITE_OTHER): Payer: 59

## 2021-05-05 ENCOUNTER — Telehealth: Payer: Self-pay | Admitting: Family

## 2021-05-05 DIAGNOSIS — E119 Type 2 diabetes mellitus without complications: Secondary | ICD-10-CM

## 2021-05-05 DIAGNOSIS — R739 Hyperglycemia, unspecified: Secondary | ICD-10-CM | POA: Diagnosis not present

## 2021-05-05 LAB — HEMOGLOBIN A1C: Hgb A1c MFr Bld: 7.7 % — ABNORMAL HIGH (ref 4.6–6.5)

## 2021-05-05 NOTE — Telephone Encounter (Signed)
Patient advised of results and provider's comments. She will follow up in February as schedule

## 2021-05-05 NOTE — Telephone Encounter (Signed)
A1C confirms new diagnosis of diabetes.  I would recommend that she work on limiting carbs, increasing lean proteins/non-starchy vegetables and avoiding concentrated sweets.  Exercise can also help bring down sugars.  Follow up in 3 months.

## 2021-05-12 ENCOUNTER — Other Ambulatory Visit (HOSPITAL_BASED_OUTPATIENT_CLINIC_OR_DEPARTMENT_OTHER): Payer: Self-pay

## 2021-05-12 ENCOUNTER — Other Ambulatory Visit: Payer: Self-pay | Admitting: Family

## 2021-05-12 NOTE — Telephone Encounter (Signed)
Requesting: Ambien CR 12.5mg   Contract: 01/16/2021 UDS: 04/28/2021 Last Visit: 04/28/2021 Next Visit: 06/02/2021 Last Refill: 03/02/2021 #30 and 0RF  Please Advise

## 2021-05-14 ENCOUNTER — Other Ambulatory Visit (HOSPITAL_BASED_OUTPATIENT_CLINIC_OR_DEPARTMENT_OTHER): Payer: Self-pay

## 2021-05-14 MED ORDER — ZOLPIDEM TARTRATE ER 12.5 MG PO TBCR
EXTENDED_RELEASE_TABLET | ORAL | 0 refills | Status: DC
  Filled 2021-05-14: qty 30, 30d supply, fill #0

## 2021-06-02 ENCOUNTER — Other Ambulatory Visit (HOSPITAL_BASED_OUTPATIENT_CLINIC_OR_DEPARTMENT_OTHER): Payer: Self-pay

## 2021-06-02 ENCOUNTER — Other Ambulatory Visit: Payer: Self-pay

## 2021-06-02 ENCOUNTER — Telehealth: Payer: Self-pay | Admitting: Family

## 2021-06-02 ENCOUNTER — Encounter (HOSPITAL_BASED_OUTPATIENT_CLINIC_OR_DEPARTMENT_OTHER): Payer: Self-pay

## 2021-06-02 ENCOUNTER — Ambulatory Visit (HOSPITAL_BASED_OUTPATIENT_CLINIC_OR_DEPARTMENT_OTHER)
Admission: RE | Admit: 2021-06-02 | Discharge: 2021-06-02 | Disposition: A | Discharge status: Home / Self Care | Payer: 59 | Source: Ambulatory Visit | Attending: Family | Admitting: Family

## 2021-06-02 ENCOUNTER — Encounter: Payer: Self-pay | Admitting: Family

## 2021-06-02 ENCOUNTER — Ambulatory Visit (INDEPENDENT_AMBULATORY_CARE_PROVIDER_SITE_OTHER): Payer: 59 | Admitting: Family

## 2021-06-02 VITALS — BP 135/74 | HR 104 | Temp 98.8°F | Resp 16 | Ht 62.0 in | Wt 158.8 lb

## 2021-06-02 DIAGNOSIS — Z1231 Encounter for screening mammogram for malignant neoplasm of breast: Secondary | ICD-10-CM | POA: Diagnosis not present

## 2021-06-02 DIAGNOSIS — Z1159 Encounter for screening for other viral diseases: Secondary | ICD-10-CM

## 2021-06-02 DIAGNOSIS — Z1211 Encounter for screening for malignant neoplasm of colon: Secondary | ICD-10-CM

## 2021-06-02 DIAGNOSIS — Z0000 Encounter for general adult medical examination without abnormal findings: Secondary | ICD-10-CM

## 2021-06-02 DIAGNOSIS — I10 Essential (primary) hypertension: Secondary | ICD-10-CM

## 2021-06-02 DIAGNOSIS — E119 Type 2 diabetes mellitus without complications: Secondary | ICD-10-CM

## 2021-06-02 DIAGNOSIS — E785 Hyperlipidemia, unspecified: Secondary | ICD-10-CM | POA: Diagnosis not present

## 2021-06-02 MED ORDER — MICROLET LANCETS MISC
0 refills | Status: DC
  Filled 2021-06-02: qty 100, 25d supply, fill #0

## 2021-06-02 MED ORDER — BLOOD GLUCOSE MONITOR KIT
PACK | 0 refills | Status: DC
  Filled 2021-06-02 (×2): qty 1, 30d supply, fill #0

## 2021-06-02 MED ORDER — GLUCOSE BLOOD VI STRP
ORAL_STRIP | 0 refills | Status: DC
  Filled 2021-06-02: qty 100, 25d supply, fill #0

## 2021-06-02 MED ORDER — NEXPLANON 68 MG ~~LOC~~ IMPL: 1.0000 | DRUG_IMPLANT | Freq: Once | SUBCUTANEOUS | 0 refills | Status: AC

## 2021-06-02 MED ORDER — ATORVASTATIN CALCIUM 10 MG PO TABS
10.0000 mg | ORAL_TABLET | Freq: Every day | ORAL | 1 refills | Status: DC
  Filled 2021-06-02: qty 90, 90d supply, fill #0

## 2021-06-02 NOTE — Telephone Encounter (Signed)
Record release faxed to [facility] eye

## 2021-06-02 NOTE — Progress Notes (Signed)
Subjective:   By signing my name below, I, Lindsey Burton, attest that this documentation has been prepared under the direction and in the presence of Lindsey Alar, NP 06/02/2021    Patient ID: Lindsey Burton, female    DOB: 1975-08-26, 46 y.o.   MRN: [id number]  Chief Complaint  Patient presents with   Annual Exam     Here for Annual Exam     HPI Patient is in today for a comprehensive physical exam.  Diabetes- Her last a1c measured 7.7. She has since been regularly exercising on a treadmill and dieting. She does not have a glucose monitor to check her blood sugar at home. Her father, sister, and paternal aunt has a history of diabetes.  Lab Results  Component Value Date   HGBA1C 7.7 (H) 05/05/2021   Blood pressure- During last visit her amlodipine increased from 5 to 10 mg  and she reports no new issues while taking it. She reports having increased stress in her home life which may have increased her blood pressure.  BP Readings from Last 3 Encounters:  06/02/21 135/74  04/28/21 (!) 146/93  03/15/21 138/88   Pulse Readings from Last 3 Encounters:  06/02/21 (!) 104  04/28/21 (!) 108  03/15/21 75   Cholesterol- Her last cholesterol levels were normal but are slightly elevated for a diabetic.  Lab Results  Component Value Date   CHOL 210 (H) 06/02/2021   HDL 41.60 06/02/2021   LDLCALC 153 (H) 06/02/2021   TRIG 77.0 06/02/2021   CHOLHDL 5 06/02/2021   Kenalog injection- She reports having a kenalog injection in her scalp earlier this year for alopecia.   She denies having any unexpected weight change, ear pain, hearing loss and rhinorrhea, visual disturbance, cough, chest pain and leg swelling, nausea, vomiting, diarrhea, constipation, blood in stool, or dysuria and frequency, for myalgias and arthralgias, rash, headaches, adenopathy, depression or anxiety at this time. Social history: She has no recent changes to her family medical history. She is currently  sexually active with one female partner. She does not use drugs. She does not smoke tobacco products. She uses an nexplanon and condom for birth control. She is currently divorced. She currently lives at home with her daughter. She has no recent menstrual cycles.  Immunizations: She is UTD on tetanus vaccines. She does not have the flu vaccine. She has 2 pfizer Covid-19 vaccines at this time. She is interested in hepatitis C screening.  Diet: She is improving her diet at this time. She is cutting out pork and is reducing the amount of beef she is eating.  Exercise: She is participating in regular exercise by using a treadmill, bicycle, elliptical 5 days every week.  Colonoscopy: Not yet completed.  Pap Smear: Last completed 12/18/2018. Results are normal. Repeat in 3 years.  Mammogram: Last completed 02/10/2020. Results are normal. Repeat in 1 year.  Dental: She is UTD on dental care.  Vision: She is UTD on vision care.   Health Maintenance Due  Topic Date Due   OPHTHALMOLOGY EXAM  Never done   Hepatitis C Screening  Never done   COLONOSCOPY (Pts 45-70yr Insurance coverage will need to be confirmed)  Never done   COVID-19 Vaccine (3 - Booster for [employer]) 09/21/2019    Past Medical History:  Diagnosis Date   Alopecia    Getting kenalog injections prn   Anxiety    Depression    Hypertension    Insomnia    Migraines  Migraines    Vitamin D deficiency     Past Surgical History:  Procedure Laterality Date   LIPOSUCTION  09/2017    Family History  Problem Relation Age of Onset   Hypertension Father    Diabetes Father    Hyperlipidemia Father    Heart attack Father    Diabetes Sister    Hypertension Sister    Cancer Neg Hx     Social History   Socioeconomic History   Marital status: Married    Spouse name: Not on file   Number of children: Not on file   Years of education: Not on file   Highest education level: Not on file  Occupational History   Not on file   Tobacco Use   Smoking status: Never   Smokeless tobacco: Never  Substance and Sexual Activity   Alcohol use: No   Drug use: No   Sexual activity: Yes    Partners: Male    Birth control/protection: Implant  Other Topics Concern   Not on file  Social History Narrative   Daughter, age 19 autistic Does well in school   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 90 tablet 1   escitalopram (LEXAPRO) 20 MG tablet Take 1 tablet (20 mg total) by mouth daily. 90 tablet 1   fluticasone (FLONASE) 50 MCG/ACT nasal spray PLACE 2 SPRAYS INTO BOTH NOSTRILS DAILY 16 g 0   metoprolol succinate (TOPROL-XL) 100 MG 24 hr tablet TAKE 1 TABLET BY MOUTH DAILY WITH OR IMMEDIATELY FOLLOWING A MEAL 90 tablet 1   Multiple Vitamins-Minerals (MULTIVITAMIN WITH MINERALS) tablet Take 1 tablet by mouth daily. 30 tablet    SUMAtriptan (IMITREX) 100 MG tablet TAKE ONE TABLET BY MOUTH AT START OF HEADACHE, MAY REPEAT 2 HOURS LATER AS NEEDED. MAX 2 DOSE PER 24 HOURS 10 tablet 5   Tazarotene (ARAZLO) 0.045 % LOTN Apply topically.     Triamcinolone Acetonide (KENALOG IJ) Inject 60 mg as directed. As needed or Every 1 1/2 months for Alopecia     zolpidem (AMBIEN CR) 12.5 MG CR tablet Take 1 tablet by mouth at bedtime as needed 30 tablet 0   No facility-administered medications prior to visit.    Allergies  Allergen Reactions   Ace Inhibitors Swelling    angioedema    Lisinopril Swelling    Review of Systems   Constitutional:        (-)unexpected weight change (-)Adenopathy  HENT:  Negative for hearing loss.        (-)Rhinorrhea   Eyes:        (-)Visual disturbance  Respiratory:  Negative for cough.   Cardiovascular:  Negative for chest pain and leg swelling.  Gastrointestinal:  Negative for blood in stool, constipation, diarrhea, nausea and vomiting.  Genitourinary:  Negative for dysuria and frequency.  Musculoskeletal:  Negative for joint pain and myalgias.  Skin:  Negative for rash.  Neurological:  Negative  for headaches.  Psychiatric/Behavioral:  Negative for depression. The patient is not nervous/anxious.       Objective:      BP 135/74    Pulse (!) 104    Temp 98.8 F (37.1 C) (Oral)    Resp 16    Ht 5' 2" (1.575 m)    Wt 158 lb 12.8 oz (72 kg)    SpO2 100%    BMI 29.04 kg/m  Wt Readings from Last 3 Encounters:  06/02/21 158 lb 12.8 oz (72 kg)  04/28/21 164 lb (74.4 kg)  03/15/21 166 lb (75.3 kg)      Physical Exam  Constitutional: She is oriented to person, place, and time. She appears well-developed and well-nourished. No distress.  HENT:  Head: Normocephalic and atraumatic.  Right Ear: Tympanic membrane and ear canal normal.  Left Ear: Tympanic membrane and ear canal normal.  Mouth/Throat: not examined Eyes: Pupils are equal, round, and reactive to light. No scleral icterus.  Neck: Normal range of motion. No thyromegaly present.  Cardiovascular: Normal rate and regular rhythm.   No murmur heard. Pulmonary/Chest: Effort normal and breath sounds normal. No respiratory distress. He has no wheezes. She has no rales. She exhibits no tenderness.  Abdominal: Soft. Bowel sounds are normal. She exhibits no distension and no mass. There is no tenderness. There is no rebound and no guarding.  Musculoskeletal: She exhibits no edema.  Lymphadenopathy:    She has no cervical adenopathy.  Neurological: She is alert and oriented to person, place, and time. She has normal patellar  reflexes. She exhibits normal muscle tone. Coordination normal.  Skin: Skin is warm and dry.  Psychiatric: She has a normal mood and affect. Her behavior is normal. Judgment and thought content normal.  Breast/pelvic: deferred           Assessment & Plan:    Assessment & Plan:   Problem List Items Addressed This Visit       Unprioritized   Type 2 diabetes mellitus without complication, without long-term current use of insulin ([place])    New. Rx provided for glucometer/strips and pt educated on diabetic diet/glycemic goals.       Relevant Medications   atorvastatin (LIPITOR) 10 MG tablet   Other Relevant Orders   Urine Microalbumin w/creat. ratio (Completed)   Routine general medical examination at a health care facility - Primary    Refer for colo, mammo. Pap up to date. Check hep C.  Discussed diet/exercise/weight loss.       Hyperlipidemia    Will initiate statin for CV risk reduction.       Relevant Medications   atorvastatin (LIPITOR) 10 MG tablet   Other Relevant Orders   Lipid panel (Completed)   Essential hypertension    BP is improved on increased dose of amlodipine.  Continue same.       Relevant Medications   atorvastatin (LIPITOR) 10 MG tablet   Other Visit Diagnoses     Need for hepatitis C screening test       Relevant Orders   Hepatitis C Antibody   Colon cancer screening       Relevant Orders   Ambulatory referral to Gastroenterology   Preventative health care       Relevant Orders   MM 3D SCREEN BREAST BILATERAL (Completed)        Meds ordered this encounter  Medications   blood glucose meter kit and supplies KIT    Sig: Use up to four  times daily as directed to check blood sugar    Dispense:  1 each    Refill:  0    Order Specific Question:   Supervising Provider    Answer:   Penni Homans A [id number]    Order Specific Question:   Number of strips    Answer:   100    Order Specific Question:   Number of lancets    Answer:   100    atorvastatin (LIPITOR) 10 MG tablet    Sig: Take 1 tablet (10 mg total) by mouth daily.    Dispense:  90 tablet    Refill:  1    Order Specific Question:   Supervising Provider    Answer:   Penni Homans A [[id number]]   etonogestrel (NEXPLANON) 68 MG IMPL implant    Sig: 1 each (68 mg total) by Subdermal route once for 1 dose.    Dispense:  1 each    Refill:  0    Order Specific Question:   Supervising Provider    Answer:   Pat Patrick, NP, personally preformed the services described in this documentation.  All medical record entries made by the scribe were at my direction and in my presence.  I have reviewed the chart and discharge instructions (if applicable) and agree that the record reflects my personal performance and is accurate and complete. 06/02/2021   I,Lindsey Burton,acting as a Education administrator for Nance Pear, NP.,have documented all relevant documentation on the behalf of Nance Pear, NP,as directed by  Nance Pear, NP while in the presence of Nance Pear, NP.   Nance Pear, NP

## 2021-06-02 NOTE — Telephone Encounter (Signed)
Please request copy of DM eye exam from [facility] eye.

## 2021-06-02 NOTE — Patient Instructions (Signed)
Check blood sugar 1-2 times daily. Goal blood sugar fasting in AM is 80-110 2 hours after a meal goal is <140.

## 2021-06-03 ENCOUNTER — Other Ambulatory Visit (HOSPITAL_BASED_OUTPATIENT_CLINIC_OR_DEPARTMENT_OTHER): Payer: Self-pay

## 2021-06-03 DIAGNOSIS — E785 Hyperlipidemia, unspecified: Secondary | ICD-10-CM | POA: Insufficient documentation

## 2021-06-03 LAB — HEPATITIS C ANTIBODY
Hepatitis C Ab: NONREACTIVE
SIGNAL TO CUT-OFF: 0.02 (ref <1.00)

## 2021-06-03 LAB — LIPID PANEL
Cholesterol: 210 mg/dL — ABNORMAL HIGH (ref 0–200)
HDL: 41.6 mg/dL (ref >39.00)
LDL Cholesterol: 153 mg/dL — ABNORMAL HIGH (ref 0–99)
NonHDL: 168.04
Total CHOL/HDL Ratio: 5
Triglycerides: 77 mg/dL (ref 0.0–149.0)
VLDL: 15.4 mg/dL (ref 0.0–40.0)

## 2021-06-03 LAB — MICROALBUMIN / CREATININE URINE RATIO
Creatinine,U: 195.5 mg/dL
Microalb Creat Ratio: 1.3 mg/g (ref 0.0–30.0)
Microalb, Ur: 2.5 mg/dL — ABNORMAL HIGH (ref 0.0–1.9)

## 2021-06-03 NOTE — Assessment & Plan Note (Addendum)
Refer for colo, mammo. Pap up to date. Check hep C.  Discussed diet/exercise/weight loss.

## 2021-06-03 NOTE — Assessment & Plan Note (Signed)
Will initiate statin for CV risk reduction.

## 2021-06-03 NOTE — Assessment & Plan Note (Signed)
BP is improved on increased dose of amlodipine.  Continue same.

## 2021-06-03 NOTE — Assessment & Plan Note (Signed)
New. Rx provided for glucometer/strips and pt educated on diabetic diet/glycemic goals.

## 2021-06-15 ENCOUNTER — Telehealth: Payer: Self-pay | Admitting: Family

## 2021-06-15 NOTE — Telephone Encounter (Signed)
Reviewed chart. See that she had angioedema with ACE inhibitor. Therefore will not sent rx. Left message on her voicemail that I will not be sending due to hx of allergy.

## 2021-06-30 ENCOUNTER — Other Ambulatory Visit: Payer: Self-pay | Admitting: Family

## 2021-06-30 ENCOUNTER — Other Ambulatory Visit (HOSPITAL_BASED_OUTPATIENT_CLINIC_OR_DEPARTMENT_OTHER): Payer: Self-pay

## 2021-06-30 MED ORDER — ZOLPIDEM TARTRATE ER 12.5 MG PO TBCR
EXTENDED_RELEASE_TABLET | ORAL | 0 refills | Status: DC
  Filled 2021-06-30: qty 30, 30d supply, fill #0

## 2021-06-30 NOTE — Telephone Encounter (Signed)
Requesting: Ambien CR 12.5mg   ?Contract:12/18/2018 ?UDS: 04/28/2021  ?Last Visit: 06/02/2021 ?Next Visit: 09/01/2021  ?Last Refill: 05/14/2021 #30 and 0RF ? ?Please Advise ? ?

## 2021-07-27 ENCOUNTER — Other Ambulatory Visit: Payer: Self-pay | Admitting: Family

## 2021-07-27 ENCOUNTER — Other Ambulatory Visit (HOSPITAL_BASED_OUTPATIENT_CLINIC_OR_DEPARTMENT_OTHER): Payer: Self-pay

## 2021-07-27 MED ORDER — CONTOUR NEXT TEST VI STRP
ORAL_STRIP | 12 refills | Status: AC
Start: 2021-07-27 — End: ?
  Filled 2021-07-27: qty 100, 25d supply, fill #0
  Filled 2021-12-07: qty 100, 25d supply, fill #1
  Filled 2022-04-12 – 2022-05-10 (×2): qty 100, 25d supply, fill #2

## 2021-07-28 ENCOUNTER — Other Ambulatory Visit (HOSPITAL_BASED_OUTPATIENT_CLINIC_OR_DEPARTMENT_OTHER): Payer: Self-pay

## 2021-08-16 ENCOUNTER — Other Ambulatory Visit (HOSPITAL_BASED_OUTPATIENT_CLINIC_OR_DEPARTMENT_OTHER): Payer: Self-pay

## 2021-08-16 ENCOUNTER — Other Ambulatory Visit: Payer: Self-pay | Admitting: Family

## 2021-08-16 MED ORDER — ZOLPIDEM TARTRATE ER 12.5 MG PO TBCR
EXTENDED_RELEASE_TABLET | ORAL | 0 refills | Status: DC
Start: 2021-08-16 — End: 2021-10-25
  Filled 2021-08-16: qty 30, 30d supply, fill #0

## 2021-08-16 MED ORDER — FLUTICASONE PROPIONATE 50 MCG/ACT NA SUSP
2.0000 | Freq: Every day | NASAL | 0 refills | Status: DC
  Filled 2021-08-16: qty 16, 30d supply, fill #0

## 2021-09-01 ENCOUNTER — Ambulatory Visit (INDEPENDENT_AMBULATORY_CARE_PROVIDER_SITE_OTHER): Payer: 59 | Admitting: Family

## 2021-09-01 ENCOUNTER — Other Ambulatory Visit (HOSPITAL_BASED_OUTPATIENT_CLINIC_OR_DEPARTMENT_OTHER): Payer: Self-pay

## 2021-09-01 VITALS — BP 129/85 | HR 79 | Temp 98.6°F | Resp 16 | Wt 159.0 lb

## 2021-09-01 DIAGNOSIS — E119 Type 2 diabetes mellitus without complications: Secondary | ICD-10-CM

## 2021-09-01 DIAGNOSIS — I10 Essential (primary) hypertension: Secondary | ICD-10-CM | POA: Diagnosis not present

## 2021-09-01 DIAGNOSIS — G4700 Insomnia, unspecified: Secondary | ICD-10-CM

## 2021-09-01 DIAGNOSIS — Z8639 Personal history of other endocrine, nutritional and metabolic disease: Secondary | ICD-10-CM | POA: Diagnosis not present

## 2021-09-01 DIAGNOSIS — E785 Hyperlipidemia, unspecified: Secondary | ICD-10-CM | POA: Diagnosis not present

## 2021-09-01 LAB — LIPID PANEL
Cholesterol: 187 mg/dL (ref 0–200)
HDL: 46.2 mg/dL (ref >39.00)
LDL Cholesterol: 124 mg/dL — ABNORMAL HIGH (ref 0–99)
NonHDL: 140.99
Total CHOL/HDL Ratio: 4
Triglycerides: 87 mg/dL (ref 0.0–149.0)
VLDL: 17.4 mg/dL (ref 0.0–40.0)

## 2021-09-01 LAB — HEMOGLOBIN A1C: Hgb A1c MFr Bld: 7 % — ABNORMAL HIGH (ref 4.6–6.5)

## 2021-09-01 LAB — TSH: TSH: 0.84 u[IU]/mL (ref 0.35–5.50)

## 2021-09-01 MED ORDER — MICROLET LANCETS MISC
11 refills | Status: AC
  Filled 2021-09-01: qty 100, 25d supply, fill #0
  Filled 2021-12-07: qty 100, 25d supply, fill #1
  Filled 2022-04-12 – 2022-05-10 (×2): qty 100, 25d supply, fill #2

## 2021-09-01 NOTE — Assessment & Plan Note (Signed)
Lab Results  ?Component Value Date  ? TSH 0.99 01/17/2020  ? ? ?

## 2021-09-01 NOTE — Assessment & Plan Note (Signed)
Working hard on DM diet. Will obtain follow up A1C. ?

## 2021-09-01 NOTE — Progress Notes (Signed)
? ?Subjective:  ? ? ? Patient ID: Lindsey Burton, female    DOB: 10/28/1975, 45 y.o.   MRN: [id number] ? ?Chief Complaint  ?Patient presents with  ? Diabetes  ?  Here for follow up  ? Hypertension  ?  Here for follow up  ? ? ?HPI ?Patient is in today for routine follow up. ? ?HTN-  ?BP Readings from Last 3 Encounters:  ?09/01/21 129/85  ?06/02/21 135/74  ?04/28/21 (!) 146/93  ? ?Maintained on toprol xl and amlodipine. ? ?DM2- on DM diet only. She has improved her diet some. Eating less processed food.  ? ?Wt Readings from Last 3 Encounters:  ?09/01/21 159 lb (72.1 kg)  ?06/02/21 158 lb 12.8 oz (72 kg)  ?04/28/21 164 lb (74.4 kg)  ? ?Insomnia- continues ambien cr PRN.   ? ?Anxiety/depression- continues lexapro.   ? ?Lab Results  ?Component Value Date  ? HGBA1C 7.0 (H) 09/01/2021  ? HGBA1C 7.7 (H) 05/05/2021  ? ?Lab Results  ?Component Value Date  ? MICROALBUR 2.5 (H) 06/02/2021  ? LDLCALC 124 (H) 09/01/2021  ? CREATININE 0.77 04/28/2021  ? ?Hyperlipidemia-  ?Lab Results  ?Component Value Date  ? CHOL 187 09/01/2021  ? HDL 46.20 09/01/2021  ? LDLCALC 124 (H) 09/01/2021  ? TRIG 87.0 09/01/2021  ? CHOLHDL 4 09/01/2021  ? ? ? ?Health Maintenance Due  ?Topic Date Due  ? OPHTHALMOLOGY EXAM  Never done  ? COLONOSCOPY (Pts 45-49yrs Insurance coverage will need to be confirmed)  Never done  ? COVID-19 Vaccine (3 - Booster for Pfizer series) 09/21/2019  ? ? ?Past Medical History:  ?Diagnosis Date  ? Alopecia   ? Getting kenalog injections prn  ? Anxiety   ? Depression   ? Hypertension   ? Insomnia   ? Migraines   ? Migraines   ? Vitamin D deficiency   ? ? ?Past Surgical History:  ?Procedure Laterality Date  ? LIPOSUCTION  09/2017  ? ? ?Family History  ?Problem Relation Age of Onset  ? Hypertension Father   ? Diabetes Father   ? Hyperlipidemia Father   ? Heart attack Father   ? Diabetes Sister   ? Hypertension Sister   ? Cancer Neg Hx   ? ? ?Social History  ? ?Socioeconomic History  ? Marital status: Married  ?  Spouse name:  Not on file  ? Number of children: Not on file  ? Years of education: Not on file  ? Highest education level: Not on file  ?Occupational History  ? Not on file  ?Tobacco Use  ? Smoking status: Never  ? Smokeless tobacco: Never  ?Substance and Sexual Activity  ? Alcohol use: No  ? Drug use: No  ? Sexual activity: Yes  ?  Partners: Male  ?  Birth control/protection: Implant  ?Other Topics Concern  ? Not on file  ?Social History Narrative  ? Daughter, age 18 autistic Does well in school  ? 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 Divorced, lives with daughter  ? Enjoys TV  ? Has fish  ? ?Social Determinants of Health  ? ?Financial Resource Strain: Not on file  ?Food Insecurity: Not on file  ?Transportation Needs: Not on file  ?Physical Activity: Not on file  ?Stress: Not on file  ?Social Connections: Not on file  ?Intimate Partner Violence: Not on file  ? ? ?Outpatient Medications Prior to Visit  ?Medication Sig Dispense Refill  ? amLODipine (NORVASC) 10   MG tablet Take 1 tablet (10 mg total) by mouth daily. 90 tablet 1  ? atorvastatin (LIPITOR) 10 MG tablet Take 1 tablet (10 mg total) by mouth daily. 90 tablet 1  ? blood glucose meter kit and supplies KIT Use up to four times daily as directed to check blood sugar 1 each 0  ? escitalopram (LEXAPRO) 20 MG tablet Take 1 tablet (20 mg total) by mouth daily. 90 tablet 1  ? etonogestrel (NEXPLANON) 68 MG IMPL implant 1 each (68 mg total) by Subdermal route once for 1 dose. 1 each 0  ? fluticasone (FLONASE) 50 MCG/ACT nasal spray PLACE 2 SPRAYS INTO BOTH NOSTRILS DAILY 16 g 0  ? glucose blood (CONTOUR NEXT TEST) test strip Use up to 4 times a day as directed to check blood sugar 100 each 12  ? metoprolol succinate (TOPROL-XL) 100 MG 24 hr tablet TAKE 1 TABLET BY MOUTH DAILY WITH OR IMMEDIATELY FOLLOWING A MEAL 90 tablet 1  ? Multiple Vitamins-Minerals (MULTIVITAMIN WITH MINERALS) tablet Take 1 tablet by mouth daily. 30 tablet   ? SUMAtriptan  (IMITREX) 100 MG tablet TAKE ONE TABLET BY MOUTH AT START OF HEADACHE, MAY REPEAT 2 HOURS LATER AS NEEDED. MAX 2 DOSE PER 24 HOURS 10 tablet 5  ? Tazarotene (ARAZLO) 0.045 % LOTN Apply topically.    ? Triamcinolone Acetonide (KENALOG IJ) Inject 60 mg as directed. As needed or Every 1 1/2 months for Alopecia    ? zolpidem (AMBIEN CR) 12.5 MG CR tablet Take 1 tablet by mouth at bedtime as needed 30 tablet 0  ? Microlet Lancets MISC use up to 4 times a day as directed to check blood sugar 100 each 0  ? ?No facility-administered medications prior to visit.  ? ? ?Allergies  ?Allergen Reactions  ? Ace Inhibitors Swelling  ?  angioedema ?  ? Lisinopril Swelling  ? Metformin And Related   ? ? ?ROS ?See HPI ?   ?Objective:  ?  ?Physical Exam ?Constitutional:   ?   General: She is not in acute distress. ?   Appearance: Normal appearance. She is well-developed.  ?HENT:  ?   Head: Normocephalic and atraumatic.  ?   Right Ear: External ear normal.  ?   Left Ear: External ear normal.  ?Eyes:  ?   General: No scleral icterus. ?Neck:  ?   Thyroid: No thyromegaly.  ?Cardiovascular:  ?   Rate and Rhythm: Normal rate and regular rhythm.  ?   Heart sounds: Normal heart sounds. No murmur heard. ?Pulmonary:  ?   Effort: Pulmonary effort is normal. No respiratory distress.  ?   Breath sounds: Normal breath sounds. No wheezing.  ?Musculoskeletal:  ?   Cervical back: Neck supple.  ?Skin: ?   General: Skin is warm and dry.  ?Neurological:  ?   Mental Status: She is alert and oriented to person, place, and time.  ?Psychiatric:     ?   Mood and Affect: Mood normal.     ?   Behavior: Behavior normal.     ?   Thought Content: Thought content normal.     ?   Judgment: Judgment normal.  ? ? ?BP 129/85 (BP Location: Right Arm, Patient Position: Sitting, Cuff Size: Small)   Pulse 79   Temp 98.6 ?F (37 ?C) (Oral)   Resp 16   Wt 159 lb (72.1 kg)   SpO2 100%   BMI 29.08 kg/m?  ?Wt Readings from Last 3 Encounters:  ?  09/01/21 159 lb (72.1 kg)   ?06/02/21 158 lb 12.8 oz (72 kg)  ?04/28/21 164 lb (74.4 kg)  ? ? ?   ?Assessment & Plan:  ? ?Problem List Items Addressed This Visit   ? ?  ? Unprioritized  ? Type 2 diabetes mellitus without complication, without long-term current use of insulin ([place])  ?  Working hard on DM diet. Will obtain follow up A1C. ? ?  ?  ? Relevant Orders  ? Hemoglobin A1c (Completed)  ? Insomnia  ?  Stable with prn use of ambien cr. Continue same. Contract up to date.  ? ?  ?  ? Hyperlipidemia - Primary  ?  Last visit we added atorvastatin.  Will repeat lipid panel.  ? ?  ?  ? Relevant Orders  ? Lipid panel (Completed)  ? History of hyperthyroidism  ?  Lab Results  ?Component Value Date  ? TSH 0.99 01/17/2020  ? ?  ?  ? Relevant Orders  ? TSH (Completed)  ? Essential hypertension  ?  BP at goal. Continue toprol xl and amlodipine.  ? ?  ?  ? ? ?I am having Marcelline Deist maintain her Triamcinolone Acetonide (KENALOG IJ), multivitamin with minerals, Arazlo, amLODipine, escitalopram, metoprolol succinate, SUMAtriptan, blood glucose meter kit and supplies, atorvastatin, Nexplanon, Contour Next Test, fluticasone, zolpidem, and Microlet Lancets. ? ?Meds ordered this encounter  ?Medications  ? Microlet Lancets MISC  ?  Sig: Use up to 4 times a day as directed to check blood sugar  ?  Dispense:  100 each  ?  Refill:  11  ? ?

## 2021-09-01 NOTE — Patient Instructions (Signed)
Please complete lab work prior to leaving.   

## 2021-09-01 NOTE — Assessment & Plan Note (Signed)
Stable with prn use of ambien cr. Continue same. Contract up to date.  ?

## 2021-09-01 NOTE — Assessment & Plan Note (Signed)
Last visit we added atorvastatin.  Will repeat lipid panel.  ?

## 2021-09-01 NOTE — Assessment & Plan Note (Signed)
BP at goal. Continue toprol xl and amlodipine.  ?

## 2021-09-02 ENCOUNTER — Other Ambulatory Visit (HOSPITAL_BASED_OUTPATIENT_CLINIC_OR_DEPARTMENT_OTHER): Payer: Self-pay

## 2021-09-03 ENCOUNTER — Telehealth: Payer: Self-pay | Admitting: Family

## 2021-09-03 ENCOUNTER — Other Ambulatory Visit (HOSPITAL_BASED_OUTPATIENT_CLINIC_OR_DEPARTMENT_OTHER): Payer: Self-pay

## 2021-09-03 MED ORDER — ATORVASTATIN CALCIUM 20 MG PO TABS
20.0000 mg | ORAL_TABLET | Freq: Every day | ORAL | 1 refills | Status: DC
  Filled 2021-09-03: qty 90, 90d supply, fill #0
  Filled 2021-12-07: qty 90, 90d supply, fill #1

## 2021-09-03 NOTE — Telephone Encounter (Signed)
Called Pt and Pt was Advised of the results

## 2021-09-03 NOTE — Telephone Encounter (Signed)
A1C is down to 7.0 from 7.7. Cholesterol is improved but still above goal. I would like to increase atorvastatin from 10mg  to 20mg .

## 2021-09-20 ENCOUNTER — Other Ambulatory Visit: Payer: Self-pay | Admitting: Family Medicine

## 2021-10-25 ENCOUNTER — Other Ambulatory Visit (HOSPITAL_BASED_OUTPATIENT_CLINIC_OR_DEPARTMENT_OTHER): Payer: Self-pay

## 2021-10-25 ENCOUNTER — Other Ambulatory Visit: Payer: Self-pay | Admitting: Family

## 2021-10-25 NOTE — Telephone Encounter (Signed)
Ambien Last RX: 08-16-2021 Last OV:09-01-2021 Next OV: 12-08-21 UDS: 04-28-2021 CSC: 04-28-2021

## 2021-10-26 ENCOUNTER — Other Ambulatory Visit (HOSPITAL_BASED_OUTPATIENT_CLINIC_OR_DEPARTMENT_OTHER): Payer: Self-pay

## 2021-10-26 MED ORDER — FLUTICASONE PROPIONATE 50 MCG/ACT NA SUSP
2.0000 | Freq: Every day | NASAL | 0 refills | Status: DC
  Filled 2021-10-26: qty 16, 30d supply, fill #0

## 2021-10-26 MED ORDER — ZOLPIDEM TARTRATE ER 12.5 MG PO TBCR
EXTENDED_RELEASE_TABLET | ORAL | 0 refills | Status: DC
  Filled 2021-10-26: qty 30, 30d supply, fill #0

## 2021-10-26 MED ORDER — ESCITALOPRAM OXALATE 20 MG PO TABS
20.0000 mg | ORAL_TABLET | Freq: Every day | ORAL | 1 refills | Status: DC
  Filled 2021-10-26: qty 90, 90d supply, fill #0
  Filled 2022-04-12: qty 90, 90d supply, fill #1

## 2021-10-26 MED ORDER — METOPROLOL SUCCINATE ER 100 MG PO TB24
ORAL_TABLET | ORAL | 1 refills | Status: DC
  Filled 2021-10-26: qty 90, 90d supply, fill #0
  Filled 2022-03-18: qty 90, 90d supply, fill #1

## 2021-12-07 ENCOUNTER — Other Ambulatory Visit: Payer: Self-pay | Admitting: Family

## 2021-12-07 ENCOUNTER — Other Ambulatory Visit (HOSPITAL_BASED_OUTPATIENT_CLINIC_OR_DEPARTMENT_OTHER): Payer: Self-pay

## 2021-12-07 MED ORDER — ZOLPIDEM TARTRATE ER 12.5 MG PO TBCR
EXTENDED_RELEASE_TABLET | ORAL | 0 refills | Status: DC
  Filled 2021-12-07: qty 30, 30d supply, fill #0

## 2021-12-07 MED ORDER — FLUTICASONE PROPIONATE 50 MCG/ACT NA SUSP
2.0000 | Freq: Every day | NASAL | 5 refills | Status: DC
Start: 2021-12-07 — End: 2023-11-14
  Filled 2021-12-07: qty 16, 30d supply, fill #0
  Filled 2022-04-12: qty 16, 30d supply, fill #1
  Filled 2022-05-10: qty 16, 30d supply, fill #2
  Filled 2022-06-15: qty 16, 30d supply, fill #3

## 2021-12-07 MED ORDER — AMLODIPINE BESYLATE 10 MG PO TABS
10.0000 mg | ORAL_TABLET | Freq: Every day | ORAL | 1 refills | Status: DC
  Filled 2021-12-07: qty 90, 90d supply, fill #0
  Filled 2022-03-18: qty 90, 90d supply, fill #1

## 2021-12-07 NOTE — Telephone Encounter (Signed)
Requesting: Ambien 12.5mg   Contract:01/17/20 UDS:04/28/21 Last Visit: 09/01/21 Next Visit: 12/08/21 Last Refill: 10/26/21 #30 and 0RF  Please Advise

## 2021-12-08 ENCOUNTER — Other Ambulatory Visit (HOSPITAL_BASED_OUTPATIENT_CLINIC_OR_DEPARTMENT_OTHER): Payer: Self-pay

## 2021-12-08 ENCOUNTER — Ambulatory Visit: Payer: BLUE CROSS/BLUE SHIELD | Admitting: Family

## 2022-03-16 ENCOUNTER — Ambulatory Visit: Payer: Commercial Managed Care - HMO | Admitting: Family

## 2022-03-18 ENCOUNTER — Ambulatory Visit (INDEPENDENT_AMBULATORY_CARE_PROVIDER_SITE_OTHER): Payer: Commercial Managed Care - HMO | Admitting: Family

## 2022-03-18 ENCOUNTER — Other Ambulatory Visit: Payer: Self-pay | Admitting: Family

## 2022-03-18 ENCOUNTER — Other Ambulatory Visit (HOSPITAL_BASED_OUTPATIENT_CLINIC_OR_DEPARTMENT_OTHER): Payer: Self-pay

## 2022-03-18 VITALS — BP 145/90 | HR 75 | Temp 98.0°F | Resp 18 | Ht 62.0 in | Wt 147.2 lb

## 2022-03-18 DIAGNOSIS — I10 Essential (primary) hypertension: Secondary | ICD-10-CM | POA: Diagnosis not present

## 2022-03-18 DIAGNOSIS — L659 Nonscarring hair loss, unspecified: Secondary | ICD-10-CM | POA: Diagnosis not present

## 2022-03-18 DIAGNOSIS — F419 Anxiety disorder, unspecified: Secondary | ICD-10-CM

## 2022-03-18 DIAGNOSIS — E785 Hyperlipidemia, unspecified: Secondary | ICD-10-CM

## 2022-03-18 DIAGNOSIS — G4700 Insomnia, unspecified: Secondary | ICD-10-CM

## 2022-03-18 DIAGNOSIS — F32A Depression, unspecified: Secondary | ICD-10-CM

## 2022-03-18 DIAGNOSIS — G43909 Migraine, unspecified, not intractable, without status migrainosus: Secondary | ICD-10-CM | POA: Diagnosis not present

## 2022-03-18 DIAGNOSIS — E119 Type 2 diabetes mellitus without complications: Secondary | ICD-10-CM

## 2022-03-18 MED ORDER — HYDROCHLOROTHIAZIDE 25 MG PO TABS
25.0000 mg | ORAL_TABLET | Freq: Every day | ORAL | 1 refills | Status: DC
  Filled 2022-03-18: qty 90, 90d supply, fill #0
  Filled 2022-06-15: qty 90, 90d supply, fill #1

## 2022-03-18 NOTE — Progress Notes (Signed)
Subjective:   By signing my name below, I, [name], attest that this documentation has been prepared under the direction and in the presence of [name], NP. 03/18/2022      Patient ID: Lindsey Burton, female    DOB: 1975-09-02, 46 y.o.   MRN: [id number]  Chief Complaint  Patient presents with   Follow-up    HPI Patient is in today for a follow up visit.   Blood pressure: Her blood pressure is elevated during this visit. She continues taking 100 mg metoprolol succinate daily PO, 10 mg amlodipine daily PO and reports no new issues while taking them. She checks her blood pressure at home and finds it measured elevated half the times and normal the rest.  BP Readings from Last 3 Encounters:  03/18/22 (!) 159/91  09/01/21 129/85  06/02/21 135/74   Pulse Readings from Last 3 Encounters:  03/18/22 75  09/01/21 79  06/02/21 (!) 104   Migraines: She continues having occasional migraines. She is taking 100 mg Imitrex when her symptoms worsen.   Alopecia: She has received kenalog injections to help manage her alopecia. Her last injections was Monday earlier this week. She has had around 3 injections this year. She notices small hair growth.   Sleep: She is sleeping well at this time. She continues using Ambien PRN and reports no new issues while taking it.   Mood: Her mood is improved at this time. She is occasionally seeing her daughters therapist during their therapy session and finds she feels better after talking with them. She is interested in seeing a therapist herself.   Health Maintenance Due  Topic Date Due   OPHTHALMOLOGY EXAM  Never done   COLONOSCOPY (Pts 45-30yr Insurance coverage will need to be confirmed)  Never done   INFLUENZA VACCINE  11/16/2021   PAP SMEAR-Modifier  12/17/2021   COVID-19 Vaccine (3 - 2023-24 season) 12/17/2021   HEMOGLOBIN A1C  03/04/2022    Past Medical History:  Diagnosis Date   Alopecia    Getting kenalog injections  prn   Anxiety    Depression    Hypertension    Insomnia    Migraines    Migraines    Vitamin D deficiency     Past Surgical History:  Procedure Laterality Date   LIPOSUCTION  09/2017    Family History  Problem Relation Age of Onset   Hypertension Father    Diabetes Father    Hyperlipidemia Father    Heart attack Father    Diabetes Sister    Hypertension Sister    Cancer Neg Hx     Social History   Socioeconomic History   Marital status: Married    Spouse name: Not on file   Number of children: Not on file   Years of education: Not on file   Highest education level: Not on file  Occupational History   Not on file  Tobacco Use   Smoking status: Never   Smokeless tobacco: Never  Substance and Sexual Activity   Alcohol use: No   Drug use: No   Sexual activity: Yes    Partners: Male    Birth control/protection: Implant  Other Topics Concern   Not on file  Social History Narrative   Daughter, age 1283autistic Does well in school   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 90 tablet 1   atorvastatin (LIPITOR) 20 MG tablet Take 1 tablet (20 mg total) by mouth daily. 90 tablet 1   blood glucose meter kit and supplies KIT Use up to four times daily as directed to check blood sugar 1 each 0   escitalopram (LEXAPRO) 20 MG tablet Take 1 tablet (20 mg total) by mouth daily. 90 tablet 1   etonogestrel (NEXPLANON) 68 MG IMPL implant 1 each (68 mg total) by Subdermal route once  for 1 dose. 1 each 0   fluticasone (FLONASE) 50 MCG/ACT nasal spray PLACE 2 SPRAYS INTO BOTH NOSTRILS DAILY 16 g 5   glucose blood (CONTOUR NEXT TEST) test strip Use up to 4 times a day as directed to check blood sugar 100 each 12   metoprolol succinate (TOPROL-XL) 100 MG 24 hr tablet TAKE 1 TABLET BY MOUTH DAILY WITH OR IMMEDIATELY FOLLOWING A MEAL 90 tablet 1   Microlet Lancets MISC Use up to 4 times a day as directed to check blood sugar 100 each 11   Multiple Vitamins-Minerals (MULTIVITAMIN WITH MINERALS) tablet Take 1 tablet by mouth daily. 30 tablet    SUMAtriptan (IMITREX) 100 MG tablet TAKE ONE TABLET BY MOUTH AT START OF HEADACHE, MAY REPEAT 2 HOURS LATER AS NEEDED. MAX 2 DOSE PER 24 HOURS 10 tablet 5   Tazarotene (ARAZLO) 0.045 % LOTN Apply topically.     Triamcinolone Acetonide (KENALOG IJ) Inject 60 mg as directed. As needed or Every 1 1/2 months for Alopecia     zolpidem (AMBIEN CR) 12.5 MG CR tablet Take 1 tablet by mouth at bedtime as needed 30 tablet 0   No facility-administered medications prior to visit.    Allergies  Allergen Reactions   Ace Inhibitors Swelling    angioedema    Lisinopril Swelling   Metformin And Related     ROS     Objective:    Physical Exam Constitutional:      General: She is not in acute distress.    Appearance: Normal appearance. She is not ill-appearing.  HENT:     Head: Normocephalic and atraumatic.     Right Ear: External ear normal.     Left Ear: External ear normal.  Eyes:     Extraocular Movements: Extraocular movements intact.     Pupils: Pupils are equal, round, and reactive to light.  Cardiovascular:     Rate and Rhythm: Normal rate and regular rhythm.     Heart sounds: Normal heart sounds. No murmur heard.    No gallop.     Comments: Her blood pressure measured 145/90 during manual recheck Pulmonary:     Effort: Pulmonary effort is normal. No respiratory distress.     Breath sounds: Normal breath sounds. No wheezing or  rales.  Skin:    General: Skin is warm and dry.  Neurological:     Mental Status: She is alert and oriented to person, place, and time.  Psychiatric:        Judgment: Judgment normal.     There were no vitals taken  for this visit. Wt Readings from Last 3 Encounters:  09/01/21 159 lb (72.1 kg)  06/02/21 158 lb 12.8 oz (72 kg)  04/28/21 164 lb (74.4 kg)       Assessment & Plan:   Problem List Items Addressed This Visit   None    No orders of the defined types were placed in this encounter.   I, Shehryar [place], personally preformed the services described in this documentation.  All medical record entries made by the scribe were at my direction and in my presence.  I have reviewed the chart and discharge instructions (if applicable) and agree that the record reflects my personal performance and is accurate and complete. 03/18/2022   I,[name],acting as a scribe for Nance Pear, NP.,have documented all relevant documentation on the behalf of Nance Pear, NP,as directed by  Nance Pear, NP while in the presence of Nance Pear, NP.   Shehryar [employer]

## 2022-03-18 NOTE — Assessment & Plan Note (Addendum)
Using ambien cr nightly. Continue same. Controlled substance contract is updated.

## 2022-03-18 NOTE — Patient Instructions (Signed)
Start hctz.   

## 2022-03-18 NOTE — Assessment & Plan Note (Signed)
Continues to work with kenalog

## 2022-03-18 NOTE — Assessment & Plan Note (Signed)
BP Readings from Last 3 Encounters:  03/18/22 (!) 159/91  09/01/21 129/85  06/02/21 135/74   Stable at home on metoprolol and amlodipine.

## 2022-03-18 NOTE — Assessment & Plan Note (Signed)
Stable on lexapro.   

## 2022-03-18 NOTE — Assessment & Plan Note (Signed)
Stable with prn use of imitrex.  

## 2022-03-19 MED ORDER — ZOLPIDEM TARTRATE ER 12.5 MG PO TBCR
12.5000 mg | EXTENDED_RELEASE_TABLET | Freq: Every evening | ORAL | 0 refills | Status: DC | PRN
  Filled 2022-03-19: qty 30, 30d supply, fill #0

## 2022-03-19 MED ORDER — ATORVASTATIN CALCIUM 20 MG PO TABS
20.0000 mg | ORAL_TABLET | Freq: Every day | ORAL | 1 refills | Status: DC
  Filled 2022-03-19: qty 90, 90d supply, fill #0

## 2022-03-19 NOTE — Assessment & Plan Note (Signed)
Maintained on atorvastatin. Continue same. Obtain follow up lipid panel.  

## 2022-03-21 ENCOUNTER — Other Ambulatory Visit (HOSPITAL_BASED_OUTPATIENT_CLINIC_OR_DEPARTMENT_OTHER): Payer: Self-pay

## 2022-03-30 ENCOUNTER — Other Ambulatory Visit (INDEPENDENT_AMBULATORY_CARE_PROVIDER_SITE_OTHER): Payer: Commercial Managed Care - HMO

## 2022-03-30 DIAGNOSIS — E785 Hyperlipidemia, unspecified: Secondary | ICD-10-CM

## 2022-03-30 DIAGNOSIS — E119 Type 2 diabetes mellitus without complications: Secondary | ICD-10-CM

## 2022-03-30 NOTE — Addendum Note (Signed)
Addended by: Mervin Kung A on: 03/30/2022 03:18 PM   Modules accepted: Orders

## 2022-03-31 LAB — COMPREHENSIVE METABOLIC PANEL
ALT: 8 U/L (ref 0–35)
AST: 14 U/L (ref 0–37)
Albumin: 4.4 g/dL (ref 3.5–5.2)
Alkaline Phosphatase: 126 U/L — ABNORMAL HIGH (ref 39–117)
BUN: 11 mg/dL (ref 6–23)
CO2: 29 mEq/L (ref 19–32)
Calcium: 9.2 mg/dL (ref 8.4–10.5)
Chloride: 103 mEq/L (ref 96–112)
Creatinine, Ser: 0.73 mg/dL (ref 0.40–1.20)
GFR: 97.25 mL/min (ref >60.00)
Glucose, Bld: 108 mg/dL — ABNORMAL HIGH (ref 70–99)
Potassium: 3.7 mEq/L (ref 3.5–5.1)
Sodium: 140 mEq/L (ref 135–145)
Total Bilirubin: 0.7 mg/dL (ref 0.2–1.2)
Total Protein: 7.3 g/dL (ref 6.0–8.3)

## 2022-03-31 LAB — LIPID PANEL
Cholesterol: 206 mg/dL — ABNORMAL HIGH (ref 0–200)
HDL: 45.6 mg/dL (ref >39.00)
LDL Cholesterol: 136 mg/dL — ABNORMAL HIGH (ref 0–99)
NonHDL: 159.9
Total CHOL/HDL Ratio: 5
Triglycerides: 118 mg/dL (ref 0.0–149.0)
VLDL: 23.6 mg/dL (ref 0.0–40.0)

## 2022-03-31 LAB — HEMOGLOBIN A1C: Hgb A1c MFr Bld: 6.9 % — ABNORMAL HIGH (ref 4.6–6.5)

## 2022-04-01 ENCOUNTER — Other Ambulatory Visit: Payer: Commercial Managed Care - HMO

## 2022-04-13 ENCOUNTER — Other Ambulatory Visit: Payer: Self-pay

## 2022-05-10 ENCOUNTER — Other Ambulatory Visit: Payer: Self-pay | Admitting: Family

## 2022-05-10 ENCOUNTER — Other Ambulatory Visit (HOSPITAL_BASED_OUTPATIENT_CLINIC_OR_DEPARTMENT_OTHER): Payer: Self-pay

## 2022-05-10 MED ORDER — BLOOD GLUCOSE MONITOR KIT
PACK | 0 refills | Status: AC
  Filled 2022-05-10: qty 1, 30d supply, fill #0

## 2022-05-10 NOTE — Telephone Encounter (Signed)
Requesting: Ambien  Contract:03/25/22 UDS: 04/28/21 Last Visit: 03/18/22 Next Visit: None Last Refill: 03/19/22 #30 and 0RF   Please Advise

## 2022-05-11 ENCOUNTER — Other Ambulatory Visit (HOSPITAL_BASED_OUTPATIENT_CLINIC_OR_DEPARTMENT_OTHER): Payer: Self-pay

## 2022-05-11 MED ORDER — SUMATRIPTAN SUCCINATE 100 MG PO TABS
ORAL_TABLET | ORAL | 5 refills | Status: AC
  Filled 2022-05-11: qty 9, 15d supply, fill #0
  Filled 2022-06-15: qty 9, 15d supply, fill #1

## 2022-05-11 MED ORDER — ZOLPIDEM TARTRATE ER 12.5 MG PO TBCR
12.5000 mg | EXTENDED_RELEASE_TABLET | Freq: Every evening | ORAL | 0 refills | Status: DC | PRN
  Filled 2022-05-11: qty 30, 30d supply, fill #0

## 2022-05-16 ENCOUNTER — Other Ambulatory Visit (HOSPITAL_BASED_OUTPATIENT_CLINIC_OR_DEPARTMENT_OTHER): Payer: Self-pay

## 2022-05-16 ENCOUNTER — Ambulatory Visit (INDEPENDENT_AMBULATORY_CARE_PROVIDER_SITE_OTHER): Payer: Commercial Managed Care - HMO | Admitting: Family

## 2022-05-16 VITALS — BP 163/97 | HR 101 | Temp 98.2°F | Resp 16 | Wt 158.0 lb

## 2022-05-16 DIAGNOSIS — R21 Rash and other nonspecific skin eruption: Secondary | ICD-10-CM | POA: Insufficient documentation

## 2022-05-16 DIAGNOSIS — I10 Essential (primary) hypertension: Secondary | ICD-10-CM | POA: Diagnosis not present

## 2022-05-16 DIAGNOSIS — R350 Frequency of micturition: Secondary | ICD-10-CM | POA: Diagnosis not present

## 2022-05-16 DIAGNOSIS — J0100 Acute maxillary sinusitis, unspecified: Secondary | ICD-10-CM | POA: Insufficient documentation

## 2022-05-16 MED ORDER — AMOXICILLIN-POT CLAVULANATE 875-125 MG PO TABS
1.0000 | ORAL_TABLET | Freq: Two times a day (BID) | ORAL | 0 refills | Status: DC
  Filled 2022-05-16: qty 20, 10d supply, fill #0

## 2022-05-16 MED ORDER — BETAMETHASONE DIPROPIONATE 0.05 % EX CREA
TOPICAL_CREAM | Freq: Two times a day (BID) | CUTANEOUS | 0 refills | Status: AC
  Filled 2022-05-16: qty 30, 15d supply, fill #0

## 2022-05-16 NOTE — Progress Notes (Signed)
Subjective:   By signing my name below, I, Lindsey Burton, attest that this documentation has been prepared under the direction and in the presence of Lindsey Alar, NP. 05/16/2022   Patient ID: Lindsey Burton, female    DOB: 1976-03-13, 47 y.o.   MRN: [id number]  Chief Complaint  Patient presents with   Nasal Congestion    Complains of nasal, sinus congestion    HPI Patient is in today for a office visit.   Congestion: She complains of nasal congestion, sore throat, and headache since 05/07/2022. She was taking Theraflu in the morning, allergy medication in the afternoon, and Nyquil at night and found relief. Her symptoms worsened a couple days ago and her current medications were not working. She reports receiving a kenalog injection on 05/09/2022.  Rash: She also reports having a dry itching rash on her left neck.   Frequency: She is urinating more frequently. She thinks it could be from her new fluid medication or taking Theraflu more often.   Blood pressure: her blood pressure is elevated during this visit. She continues taking 10 mg amlodipine daily PO, 25 mg hydrochlorothiazide daily PO, 100 mg metoprolol succinate daily PO and reports no new issues while taking it.  BP Readings from Last 3 Encounters:  05/16/22 (!) 163/97  03/18/22 (!) 145/90  09/01/21 129/85   Pulse Readings from Last 3 Encounters:  05/16/22 (!) 101  03/18/22 75  09/01/21 79    Past Medical History:  Diagnosis Date   Alopecia    Getting kenalog injections prn   Anxiety    Depression    Hypertension    Insomnia    Migraines    Migraines    Vitamin D deficiency     Past Surgical History:  Procedure Laterality Date   LIPOSUCTION  09/2017    Family History  Problem Relation Age of Onset   Hypertension Father    Diabetes Father    Hyperlipidemia Father    Heart attack Father    Diabetes Sister    Hypertension Sister    Cancer Neg Hx     Social History   Socioeconomic  History   Marital status: Married    Spouse name: Not on file   Number of children: Not on file   Years of education: Not on file   Highest education level: Not on file  Occupational History   Not on file  Tobacco Use   Smoking status: Never   Smokeless tobacco: Never  Substance and Sexual Activity   Alcohol use: No   Drug use: No   Sexual activity: Yes    Partners: Male    Birth control/protection: Implant  Other Topics Concern   Not on file  Social History Narrative   Daughter, age 46 autistic Does well in school   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 90 tablet 1   atorvastatin (LIPITOR) 20 MG tablet Take 1 tablet (  20 mg total) by mouth daily. 90 tablet 1   blood glucose meter kit and supplies KIT Use up to four times daily as directed to check blood sugar 1 each 0   escitalopram (LEXAPRO) 20 MG tablet Take 1 tablet (20 mg total) by mouth daily. 90 tablet 1   fluticasone (FLONASE) 50 MCG/ACT nasal spray PLACE 2 SPRAYS INTO BOTH NOSTRILS DAILY 16 g 5   glucose blood (CONTOUR NEXT TEST) test strip Use up to 4 times a day as directed to check blood sugar 100 each 12   hydrochlorothiazide (HYDRODIURIL) 25 MG tablet Take 1 tablet (25 mg total) by mouth daily. 90 tablet 1   metoprolol succinate (TOPROL-XL) 100 MG 24 hr tablet TAKE 1 TABLET BY MOUTH DAILY WITH OR IMMEDIATELY FOLLOWING A MEAL 90 tablet 1   Microlet Lancets MISC Use up to 4 times a day as directed to check blood sugar  100 each 11   Multiple Vitamins-Minerals (MULTIVITAMIN WITH MINERALS) tablet Take 1 tablet by mouth daily. 30 tablet    SUMAtriptan (IMITREX) 100 MG tablet TAKE ONE TABLET BY MOUTH AT START OF HEADACHE, MAY REPEAT 2 HOURS LATER AS NEEDED. MAX 2 DOSE PER 24 HOURS 10 tablet 5   Tazarotene (ARAZLO) 0.045 % LOTN Apply topically.     Triamcinolone Acetonide (KENALOG IJ) Inject 60 mg as directed. As needed or Every 1 1/2 months for Alopecia     zolpidem (AMBIEN CR) 12.5 MG CR tablet Take 1 tablet (12.5 mg total) by mouth at bedtime as needed. 30 tablet 0   etonogestrel (NEXPLANON) 68 MG IMPL implant 1 each (68 mg total) by Subdermal route once for 1 dose. 1 each 0   No facility-administered medications prior to visit.    Allergies  Allergen Reactions   Ace Inhibitors Swelling    angioedema    Lisinopril Swelling   Metformin And Related     Review of Systems  HENT:  Positive for congestion and sore throat.   Skin:        (+)dry itching rash left neck  Neurological:  Positive for headaches.       Objective:    Physical Exam Constitutional:      General: She is not in acute distress.    Appearance: Normal appearance. She is not ill-appearing.  HENT:     Head: Normocephalic and atraumatic.     Right Ear: Tympanic membrane, ear canal and external ear normal.     Left Ear: Tympanic membrane, ear canal and external ear normal.     Nose:     Right Sinus: Maxillary sinus tenderness present. No frontal sinus tenderness.     Left Sinus: Maxillary sinus tenderness present. No frontal sinus tenderness.     Mouth/Throat:     Mouth: Mucous membranes are moist.     Pharynx: Oropharynx is clear. No oropharyngeal exudate or posterior oropharyngeal erythema.  Eyes:     Extraocular Movements: Extraocular movements intact.     Pupils: Pupils are equal, round, and reactive to light.  Cardiovascular:     Rate and Rhythm: Normal rate and regular rhythm.     Heart sounds: Normal heart sounds. No  murmur heard.    No gallop.     Comments: Her blood pressure measured 164/45 during manual recheck Pulmonary:     Effort: Pulmonary effort is normal. No respiratory distress.     Breath sounds: Normal breath sounds. No wheezing or rales.  Skin:    General: Skin is warm  and dry.     Comments: dry rash left neck  Neurological:     Mental Status: She is alert and oriented to person, place, and time.  Psychiatric:        Judgment: Judgment normal.     BP (!) 163/97 (BP Location: Right Arm, Patient Position: Sitting, Cuff Size: Small)   Pulse (!) 101   Temp 98.2 F (36.8 C) (Oral)   Resp 16   Wt 158 lb (71.7 kg)   SpO2 100%   BMI 28.90 kg/m  Wt Readings from Last 3 Encounters:  05/16/22 158 lb (71.7 kg)  03/18/22 147 lb 3.2 oz (66.8 kg)  09/01/21 159 lb (72.1 kg)       Assessment & Plan:  Urinary frequency -     Urine Culture  Acute maxillary sinusitis, recurrence not specified Assessment & Plan: New. Will rx with augmentin. Pt is advised to call if symptoms worsen or if symptoms fail to improve in the next 3-4 days.  Orders: -     Amoxicillin-Pot Clavulanate; Take 1 tablet by mouth 2 (two) times daily.  Dispense: 20 tablet; Refill: 0  Essential hypertension Assessment & Plan: Uncontrolled.  Did not take AM meds and has been taking sudafed. Advised pt to discontinue use of sudafed, restart medications. Follow back up in 1 month for bp recheck.    Skin rash Assessment & Plan: Appears eczematous. Will give trial of steroid cream.    Other orders -     Betamethasone Dipropionate; Apply topically 2 (two) times daily.  Dispense: 30 g; Refill: 0    I, Nance Pear, NP, personally preformed the services described in this documentation.  All medical record entries made by the scribe were at my direction and in my presence.  I have reviewed the chart and discharge instructions (if applicable) and agree that the record reflects my personal performance and is  accurate and complete. 05/16/2022   I,Lindsey Burton,acting as a Education administrator for Nance Pear, NP.,have documented all relevant documentation on the behalf of Nance Pear, NP,as directed by  Nance Pear, NP while in the presence of Nance Pear, NP.   Nance Pear, NP

## 2022-05-16 NOTE — Assessment & Plan Note (Signed)
Appears eczematous. Will give trial of steroid cream.

## 2022-05-16 NOTE — Assessment & Plan Note (Signed)
New. Will rx with augmentin. Pt is advised to call if symptoms worsen or if symptoms fail to improve in the next 3-4 days.

## 2022-05-16 NOTE — Assessment & Plan Note (Signed)
Uncontrolled.  Did not take AM meds and has been taking sudafed. Advised pt to discontinue use of sudafed, restart medications. Follow back up in 1 month for bp recheck.

## 2022-05-17 LAB — URINE CULTURE
MICRO NUMBER:: 14486004
Result:: NO GROWTH
SPECIMEN QUALITY:: ADEQUATE

## 2022-06-15 ENCOUNTER — Ambulatory Visit (INDEPENDENT_AMBULATORY_CARE_PROVIDER_SITE_OTHER): Payer: Commercial Managed Care - HMO | Admitting: Family

## 2022-06-15 ENCOUNTER — Other Ambulatory Visit (HOSPITAL_BASED_OUTPATIENT_CLINIC_OR_DEPARTMENT_OTHER): Payer: Self-pay

## 2022-06-15 ENCOUNTER — Encounter: Payer: Self-pay | Admitting: Family

## 2022-06-15 ENCOUNTER — Other Ambulatory Visit: Payer: Self-pay | Admitting: Family

## 2022-06-15 VITALS — BP 134/89 | HR 80 | Temp 98.4°F | Resp 16 | Wt 160.0 lb

## 2022-06-15 DIAGNOSIS — G4700 Insomnia, unspecified: Secondary | ICD-10-CM | POA: Diagnosis not present

## 2022-06-15 DIAGNOSIS — F419 Anxiety disorder, unspecified: Secondary | ICD-10-CM

## 2022-06-15 DIAGNOSIS — I10 Essential (primary) hypertension: Secondary | ICD-10-CM | POA: Diagnosis not present

## 2022-06-15 DIAGNOSIS — F32A Depression, unspecified: Secondary | ICD-10-CM | POA: Diagnosis not present

## 2022-06-15 MED ORDER — ZOLPIDEM TARTRATE ER 12.5 MG PO TBCR
12.5000 mg | EXTENDED_RELEASE_TABLET | Freq: Every evening | ORAL | 0 refills | Status: DC | PRN
  Filled 2022-06-15: qty 30, 30d supply, fill #0

## 2022-06-15 MED ORDER — AMLODIPINE BESYLATE 10 MG PO TABS
10.0000 mg | ORAL_TABLET | Freq: Every day | ORAL | 1 refills | Status: DC
  Filled 2022-06-15: qty 90, 90d supply, fill #0

## 2022-06-15 MED ORDER — METOPROLOL SUCCINATE ER 100 MG PO TB24
100.0000 mg | ORAL_TABLET | Freq: Every day | ORAL | 1 refills | Status: DC
  Filled 2022-06-15 – 2023-01-09 (×5): qty 90, 90d supply, fill #0

## 2022-06-15 NOTE — Assessment & Plan Note (Addendum)
Continues lexapro '20mg'$ . Notes increased stress recently.  She is requesting a referral to counseling. Information given to pt to call to schedule.

## 2022-06-15 NOTE — Assessment & Plan Note (Signed)
BP Readings from Last 3 Encounters:  06/15/22 134/89  05/16/22 (!) 163/97  03/18/22 (!) 145/90   BP is improved. Continue amlodipine, hctz and metoprolol.

## 2022-06-15 NOTE — Assessment & Plan Note (Signed)
Stable with prn zolpidem. Continue same.

## 2022-06-15 NOTE — Progress Notes (Signed)
Subjective:   By signing my name below, I, Jamey Reas, attest that this documentation has been prepared under the direction and in the presence of [name], NP. 06/15/2022   Patient ID: Lindsey Burton, female    DOB: 1975/06/18, 47 y.o.   MRN: [id number]  Chief Complaint  Patient presents with   Hypertension    Here for follow up    HPI Patient is in today for a follow-up appointment.   Blood pressure: Her blood pressure is stable during this appointment. She is taking 10 mg amlodipine daily PO and 100 mg metoprolol succinate daily PO.  BP Readings from Last 3 Encounters:  06/15/22 134/89  05/16/22 (!) 163/97  03/18/22 (!) 145/90    Mood: She is interested in receiving information on therapy.   Ambien: She is requesting a refill for 12.5 mg ambien.    Past Medical History:  Diagnosis Date   Alopecia    Getting kenalog injections prn   Anxiety    Depression    Hypertension    Insomnia    Migraines    Migraines    Vitamin D deficiency     Past Surgical History:  Procedure Laterality Date   LIPOSUCTION  09/2017    Family History  Problem Relation Age of Onset   Hypertension Father    Diabetes Father    Hyperlipidemia Father    Heart attack Father    Diabetes Sister    Hypertension Sister    Cancer Neg Hx     Social History   Socioeconomic History   Marital status: Married    Spouse name: Not on file   Number of children: Not on file   Years of education: Not on file   Highest education level: Not on file  Occupational History   Not on file  Tobacco Use   Smoking status: Never   Smokeless tobacco: Never  Substance and Sexual Activity   Alcohol use: No   Drug use: No   Sexual activity: Yes    Partners: Male    Birth control/protection: Implant  Other Topics Concern   Not on file  Social History Narrative   Daughter, age 89 autistic Does well in school   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 90 tablet 1   betamethasone dipropionate 0.05 % cream Apply topically 2 (two) times daily. 30 g 0   blood glucose meter kit and supplies KIT Use up to four times daily as directed to check blood sugar 1 each 0   escitalopram (LEXAPRO) 20 MG tablet Take 1 tablet (20 mg total) by mouth daily. 90 tablet 1   etonogestrel (NEXPLANON) 68 MG IMPL implant 1 each (68 mg total) by Subdermal route once for 1 dose. 1 each 0   fluticasone (FLONASE) 50 MCG/ACT nasal spray PLACE 2 SPRAYS INTO BOTH NOSTRILS DAILY 16 g 5   glucose blood (CONTOUR NEXT TEST) test strip  Use up to 4 times a day as directed to check blood sugar 100 each 12   hydrochlorothiazide (HYDRODIURIL) 25 MG tablet Take 1 tablet (25 mg total) by mouth daily. 90 tablet 1   Microlet Lancets MISC Use up to 4 times a day as directed to check blood sugar 100 each 11   Multiple Vitamins-Minerals (MULTIVITAMIN WITH MINERALS) tablet Take 1 tablet by mouth daily. 30 tablet    SUMAtriptan (IMITREX) 100 MG tablet TAKE ONE TABLET BY MOUTH AT START OF HEADACHE, MAY REPEAT 2 HOURS LATER AS NEEDED. MAX 2 DOSE PER 24 HOURS 10 tablet 5   Tazarotene (ARAZLO) 0.045 % LOTN Apply topically.     Triamcinolone Acetonide (KENALOG IJ) Inject 60 mg as directed. As needed or Every 1 1/2 months for Alopecia     amLODipine (NORVASC) 10 MG tablet Take 1 tablet (10 mg total) by mouth daily. 90 tablet 1   amoxicillin-clavulanate (AUGMENTIN) 875-125 MG tablet Take 1 tablet by mouth 2 (two) times daily.  20 tablet 0   metoprolol succinate (TOPROL-XL) 100 MG 24 hr tablet TAKE 1 TABLET BY MOUTH DAILY WITH OR IMMEDIATELY FOLLOWING A MEAL 90 tablet 1   zolpidem (AMBIEN CR) 12.5 MG CR tablet Take 1 tablet (12.5 mg total) by mouth at bedtime as needed. 30 tablet 0   No facility-administered medications prior to visit.    Allergies  Allergen Reactions   Ace Inhibitors Swelling    angioedema    Lisinopril Swelling   Metformin And Related     ROS See HPI    Objective:    Physical Exam Constitutional:      General: She is not in acute distress.    Appearance: Normal appearance.  HENT:     Head: Normocephalic and atraumatic.     Right Ear: External ear normal.     Left Ear: External ear normal.  Eyes:     Extraocular Movements: Extraocular movements intact.     Pupils: Pupils are equal, round, and reactive to light.  Skin:    General: Skin is warm.  Neurological:     Mental Status: She is alert and oriented to person, place, and time.  Psychiatric:        Judgment: Judgment normal.     BP 134/89 (BP Location: Right Arm, Patient Position: Sitting, Cuff Size: Small)   Pulse 80   Temp 98.4 F (36.9 C) (Oral)   Resp 16   Wt 160 lb (72.6 kg)   SpO2 99%   BMI 29.26 kg/m  Wt Readings from Last 3 Encounters:  06/15/22 160 lb (72.6 kg)  05/16/22 158 lb (71.7 kg)  03/18/22 147 lb 3.2 oz (66.8 kg)       Assessment & Plan:  Essential hypertension Assessment & Plan: BP Readings from Last 3 Encounters:  06/15/22 134/89  05/16/22 (!) 163/97  03/18/22 (!) 145/90   BP is improved. Continue amlodipine, hctz and metoprolol.    Anxiety and depression Assessment & Plan: Continues lexapro '20mg'$ . Notes increased stress recently.  She is requesting a referral to counseling. Information given to pt to call to schedule.    Insomnia, unspecified type Assessment & Plan: Stable with prn zolpidem. Continue same.    Other orders -     Zolpidem Tartrate ER; Take 1 tablet (12.5 mg  total) by mouth at bedtime as needed.  Dispense: 30 tablet; Refill: 0    I, Nance Pear, NP, personally preformed the services described in this documentation.  All  medical record entries made by the scribe were at my direction and in my presence.  I have reviewed the chart and discharge instructions (if applicable) and agree that the record reflects my personal performance and is accurate and complete. 06/15/2022   Lacretia Leigh as a scribe for Nance Pear, NP.,have documented all relevant documentation on the behalf of Nance Pear, NP,as directed by  Nance Pear, NP while in the presence of Nance Pear, NP.  Nance Pear, NP

## 2022-06-22 ENCOUNTER — Other Ambulatory Visit (HOSPITAL_BASED_OUTPATIENT_CLINIC_OR_DEPARTMENT_OTHER): Payer: Self-pay

## 2022-07-04 ENCOUNTER — Ambulatory Visit (INDEPENDENT_AMBULATORY_CARE_PROVIDER_SITE_OTHER): Payer: Commercial Managed Care - HMO | Admitting: Behavioral Health

## 2022-07-04 DIAGNOSIS — F331 Major depressive disorder, recurrent, moderate: Secondary | ICD-10-CM

## 2022-07-04 DIAGNOSIS — F411 Generalized anxiety disorder: Secondary | ICD-10-CM

## 2022-07-05 ENCOUNTER — Encounter: Payer: Self-pay | Admitting: Behavioral Health

## 2022-07-05 ENCOUNTER — Other Ambulatory Visit (HOSPITAL_BASED_OUTPATIENT_CLINIC_OR_DEPARTMENT_OTHER): Payer: Self-pay
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
## 2022-07-05 NOTE — Progress Notes (Signed)
                Gauge Winski M Lynleigh Kovack, LCMHC 

## 2022-07-14 ENCOUNTER — Other Ambulatory Visit: Payer: Self-pay | Admitting: *Deleted

## 2022-07-14 MED ORDER — ESCITALOPRAM OXALATE 20 MG PO TABS: 20.0000 mg | ORAL_TABLET | Freq: Every day | ORAL | 1 refills | Status: AC

## 2022-07-18 ENCOUNTER — Other Ambulatory Visit: Payer: Self-pay | Admitting: Family

## 2022-07-18 MED ORDER — ZOLPIDEM TARTRATE ER 12.5 MG PO TBCR: 12.5000 mg | EXTENDED_RELEASE_TABLET | Freq: Every evening | ORAL | 0 refills | Status: DC | PRN

## 2022-07-20 ENCOUNTER — Other Ambulatory Visit: Payer: Self-pay | Admitting: *Deleted

## 2022-07-20 MED ORDER — ATORVASTATIN CALCIUM 20 MG PO TABS: 20.0000 mg | ORAL_TABLET | Freq: Every day | ORAL | 1 refills | Status: DC

## 2022-07-20 MED ORDER — HYDROCHLOROTHIAZIDE 25 MG PO TABS: 25.0000 mg | ORAL_TABLET | Freq: Every day | ORAL | 1 refills | Status: DC

## 2022-07-25 ENCOUNTER — Ambulatory Visit (INDEPENDENT_AMBULATORY_CARE_PROVIDER_SITE_OTHER): Payer: Commercial Managed Care - HMO | Admitting: Behavioral Health

## 2022-07-25 DIAGNOSIS — F331 Major depressive disorder, recurrent, moderate: Secondary | ICD-10-CM | POA: Diagnosis not present

## 2022-07-25 DIAGNOSIS — F411 Generalized anxiety disorder: Secondary | ICD-10-CM

## 2022-07-25 NOTE — Progress Notes (Signed)
                Bowyn Mercier M Dayten Juba, LCMHC 

## 2022-07-25 NOTE — Progress Notes (Signed)
[facility] Counselor/Therapist Progress Note  Patient ID: Lindsey Burton, MRN: [id number],    Date: 07/25/2022  Time Spent: 53 minutes  Treatment Type: Individual Therapy  Reported Symptoms: Anxiety, depression  Mental Status Exam: Appearance:  Well Groomed     Behavior: Appropriate  Motor: Normal  Speech/Language:  Clear and Coherent  Affect: Appropriate  Mood: normal  Thought process: normal  Thought content:   WNL  Sensory/Perceptual disturbances:   WNL  Orientation: oriented to person, place, time/date, situation, day of week, month of year, and year  Attention: Good  Concentration: Good  Memory: WNL  Fund of knowledge:  Good  Insight:   Good  Judgment:  Good  Impulse Control: Good   Risk Assessment: Danger to Self:  No Self-injurious Behavior: No Danger to Others: No Duty to Warn:no Physical Aggression / Violence:No  Access to Firearms a concern: No  Gang Involvement:No   Subjective: The patient took a big step and that she set a very clear boundaries with her family members.  She told all of them that she would only be available by phone from 10 AM until 10 PM and 4 hours of that window she is working.  She said all of them took it well and commented on the fact that it started [place] of.  She says she does not feel any guilt about doing that but will take some getting used to especially with her sister who does not always respect the boundaries.  Her brother and mother are already respecting that window.  We talked about setting smaller boundaries subsets including how long she talks to family members especially her mother.  She has already been setting clear boundaries with social contacts in terms of expectations.  We also talked about conversations that she has with her ex-husband in terms of how she feels he should interact with her daughter.  Her daughter is not very clear boundaries with her Axis  because of the way that she feels he has handled things in a negative way in relation to his fiance.  We talked about trying to gradually get her daughter to rebuild trust with her father but knowing that the father has to make some steps forward in that direction.  Her daughter also has a good relationship with the patient's brother so I encouraged her to ask him to let the daughter spend a week with him so the patient could get a break and do some things for herself.  Interventions: Cognitive Behavioral Therapy and Dialectical Behavioral Therapy  Diagnosis: Generalized anxiety disorder, major depressive disorder, recurrent, mild to moderate  Plan: I will meet with the patient every 2 to 3 weeks in person  [name], [facility]

## 2022-07-29 ENCOUNTER — Other Ambulatory Visit (HOSPITAL_BASED_OUTPATIENT_CLINIC_OR_DEPARTMENT_OTHER): Payer: Self-pay

## 2022-08-08 ENCOUNTER — Encounter: Payer: Self-pay | Admitting: Behavioral Health

## 2022-08-08 ENCOUNTER — Ambulatory Visit (INDEPENDENT_AMBULATORY_CARE_PROVIDER_SITE_OTHER): Payer: Commercial Managed Care - HMO | Admitting: Behavioral Health

## 2022-08-08 DIAGNOSIS — F411 Generalized anxiety disorder: Secondary | ICD-10-CM | POA: Diagnosis not present

## 2022-08-08 NOTE — Progress Notes (Signed)
[facility] Counselor/Therapist Progress Note  Patient ID: Lindsey Burton, MRN: [id number],    Date: 08/08/2022  Time Spent: 56 minutes in person with the patient.  Treatment Type: Individual Therapy  Reported Symptoms: Anxiety, depression  Mental Status Exam: Appearance:  Well Groomed     Behavior: Appropriate  Motor: Normal  Speech/Language:  Clear and Coherent  Affect: Appropriate  Mood: normal  Thought process: normal  Thought content:   WNL  Sensory/Perceptual disturbances:   WNL  Orientation: oriented to person, place, time/date, situation, day of week, month of year, and year  Attention: Good  Concentration: Good  Memory: WNL  Fund of knowledge:  Good  Insight:   Good  Judgment:  Good  Impulse Control: Good   Risk Assessment: Danger to Self:  No Self-injurious Behavior: No Danger to Others: No Duty to Warn:no Physical Aggression / Violence:No  Access to Firearms a concern: No  Gang Involvement:No   Subjective: The patient is doing a good job of maintaining boundaries and that 10 AM to 10 PM window that she established with friends and family.  1 female that she had been talking to and decided he did not like those restrictions and stopped calling and that was okay with the patient.  Her siblings are doing better job of respecting that but her mom is still having a hard time with that.  I encouraged her to continue to set firm boundaries reinforcing what she had established but also started looking at what some subset type boundaries are in terms of limiting the length of goals for topic of conversation contributing to her stress and anxiety.  Looked at how she could find some time for herself.  Her daughter made a list of things that she wanted the patient and the patient's ex-husband to talk about prior to the patient's ex-husband's wedding this past weekend.  He did not answer those questions in writing but the daughter did not want to be there.  The  patient says that her husband does not seem to understand the importance of how his choices have affected the daughter that he needs to do to start to reestablish her trust.  Affect the patient because the patient has primarily responsibility for her daughter and has little time for herself unless the ex-husband steps and strengthens the relationship with the patient's daughter.  We talked about small things that she could do for self-care but also started looking at how the patient can carve out some time for herself apart from her daughter.  The patient does contract for safety having no thoughts of hurting herself or anyone else.  Interventions: Cognitive Behavioral Therapy and Dialectical Behavioral Therapy  Diagnosis: Generalized anxiety disorder, major depressive disorder, recurrent, mild to moderate  Plan: I will meet with the patient every 2 to 3 weeks in person  [name], [facility]                 [name], [facility]

## 2022-08-22 ENCOUNTER — Ambulatory Visit: Payer: Commercial Managed Care - HMO | Admitting: Behavioral Health

## 2022-08-26 ENCOUNTER — Other Ambulatory Visit: Payer: Self-pay | Admitting: *Deleted

## 2022-08-26 ENCOUNTER — Other Ambulatory Visit: Payer: Self-pay | Admitting: Family

## 2022-08-26 ENCOUNTER — Other Ambulatory Visit (HOSPITAL_BASED_OUTPATIENT_CLINIC_OR_DEPARTMENT_OTHER): Payer: Self-pay

## 2022-08-26 MED ORDER — ZOLPIDEM TARTRATE ER 12.5 MG PO TBCR: 12.5000 mg | EXTENDED_RELEASE_TABLET | Freq: Every evening | ORAL | 0 refills | Status: DC | PRN

## 2022-08-26 MED ORDER — ZOLPIDEM TARTRATE ER 12.5 MG PO TBCR
12.5000 mg | EXTENDED_RELEASE_TABLET | Freq: Every evening | ORAL | 0 refills | Status: DC | PRN
  Filled 2022-08-26: qty 30, 30d supply, fill #0

## 2022-08-26 NOTE — Telephone Encounter (Signed)
Requesting: zolpidem 12.5mg  Contract: No  last one was 04/28/21 UDS: 04/28/21 Last Visit: 06/15/22 Next Visit: 12/14/22 Last Refill: 07/18/22  Please Advise

## 2022-09-05 ENCOUNTER — Ambulatory Visit: Payer: Commercial Managed Care - HMO | Admitting: Behavioral Health

## 2022-11-08 ENCOUNTER — Encounter: Payer: Self-pay | Admitting: Behavioral Health

## 2022-11-08 ENCOUNTER — Ambulatory Visit (INDEPENDENT_AMBULATORY_CARE_PROVIDER_SITE_OTHER): Payer: Commercial Managed Care - HMO | Admitting: Behavioral Health

## 2022-11-08 DIAGNOSIS — F411 Generalized anxiety disorder: Secondary | ICD-10-CM | POA: Diagnosis not present

## 2022-11-08 NOTE — Progress Notes (Signed)
[facility] Counselor/Therapist Progress Note  Patient ID: Lindsey Burton, MRN: [id number],    Date: 11/08/2022  Time Spent: 56 minutes, 3:00 until 3:56 PM in person with the patient.  Treatment Type: Individual Therapy  Reported Symptoms: Anxiety, depression  Mental Status Exam: Appearance:  Well Groomed     Behavior: Appropriate  Motor: Normal  Speech/Language:  Clear and Coherent  Affect: Appropriate  Mood: normal  Thought process: normal  Thought content:   WNL  Sensory/Perceptual disturbances:   WNL  Orientation: oriented to person, place, time/date, situation, day of week, month of year, and year  Attention: Good  Concentration: Good  Memory: WNL  Fund of knowledge:  Good  Insight:   Good  Judgment:  Good  Impulse Control: Good   Risk Assessment: Danger to Self:  No Self-injurious Behavior: No Danger to Others: No Duty to Warn:no Physical Aggression / Violence:No  Access to Firearms a concern: No  Gang Involvement:No   Subjective: I had not seen the patient since April.  There have been several difficult things taking place which she dealt with but did create anxiety for her.  She has 2 cars and both of them were giving her issues.  She did get to the point where she realized it was time to get something she could depend on consistently and so she did get a new car which she is excited about.  The patient's legal guardian and her husband is at he was supposed to have their daughter for the summer but he called the beginning of summer saying he and his wife were taking several trips that he could not make that work.  The patient has a great relationship with her daughter but wants him to hold his part of the agreement.  He also informed her that he was laid off and is actively looking for work.  She knows that he has driving but understands the financial implications of that but is trying to minimize her anxiety related to that.  Her job is still going  well.  Her daughter will start back to working at [employer] next month.  The patient is setting very clear boundaries especially with family members both physically and all phone.  We also looked at how she is managing her social life and setting standards in that arena.  She does appear to be using her anxiety reduction techniques very well. The patient does contract for safety having no thoughts of hurting herself or anyone else.  Interventions: Cognitive Behavioral Therapy and Dialectical Behavioral Therapy  Diagnosis: Generalized anxiety disorder, major depressive disorder, recurrent, mild to moderate  Plan: I will meet with the patient every 2 to 3 weeks in person  [name], [facility]                 [name], [facility]               [name], [facility]

## 2022-11-16 ENCOUNTER — Encounter (INDEPENDENT_AMBULATORY_CARE_PROVIDER_SITE_OTHER): Payer: Self-pay

## 2022-11-22 ENCOUNTER — Encounter: Payer: Self-pay | Admitting: Behavioral Health

## 2022-11-22 ENCOUNTER — Ambulatory Visit (INDEPENDENT_AMBULATORY_CARE_PROVIDER_SITE_OTHER): Payer: Commercial Managed Care - HMO | Admitting: Behavioral Health

## 2022-11-22 DIAGNOSIS — F411 Generalized anxiety disorder: Secondary | ICD-10-CM

## 2022-11-22 NOTE — Progress Notes (Signed)
  [facility] Counselor/Therapist Progress Note  Patient ID: Lindsey Burton, MRN: [id number],    Date: 11/22/2022  Time Spent: 58 minutes, 3:00 until 3:58 PM in person with the patient.  Treatment Type: Individual Therapy  Reported Symptoms: Anxiety, depression  Mental Status Exam: Appearance:  Well Groomed     Behavior: Appropriate  Motor: Normal  Speech/Language:  Clear and Coherent  Affect: Appropriate  Mood: normal  Thought process: normal  Thought content:   WNL  Sensory/Perceptual disturbances:   WNL  Orientation: oriented to person, place, time/date, situation, day of week, month of year, and year  Attention: Good  Concentration: Good  Memory: WNL  Fund of knowledge:  Good  Insight:   Good  Judgment:  Good  Impulse Control: Good   Risk Assessment: Danger to Self:  No Self-injurious Behavior: No Danger to Others: No Duty to Warn:no Physical Aggression / Violence:No  Access to Firearms a concern: No  Gang Involvement:No   Subjective: The patient started back to work today after her sabbatical but for the most part her first day back was okay.  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  There is minimal stress there.  She is setting very clear boundaries with family members in terms of when she responds to the phone call and how long she is on phone calls with them.  She has decided to set a very clear boundaries and not lend money to anyone which she feels will take a lot of stress off of her.  Socially she is setting very clear boundaries in terms of who she is spending time with and how long.  She has standards that she does not compromise on.  Her ex-husband is helping her daughter.  And possibly returning to community college.  He has been more involved in her life since not having a job and she is letting him handle the school situation with her daughter.  The patient will give the daughter" but ultimately returned back to the  daughter and ex-husband.  He has not found a job yet but is continuing to [employer] which helps alleviate financial stress.  She feels that she is managing her anxiety level well. The patient does contract for safety having no thoughts of hurting herself or anyone else.  Interventions: Cognitive Behavioral Therapy and Dialectical Behavioral Therapy  Diagnosis: Generalized anxiety disorder, major depressive disorder, recurrent, mild to moderate  Plan: I will meet with the patient every 2 to 3 weeks in person  [name], [facility]                 [name], [facility]               [name], [facility]        [name], [facility]

## 2022-11-30 ENCOUNTER — Other Ambulatory Visit (HOSPITAL_BASED_OUTPATIENT_CLINIC_OR_DEPARTMENT_OTHER): Payer: Self-pay

## 2022-11-30 ENCOUNTER — Telehealth: Payer: Self-pay

## 2022-11-30 ENCOUNTER — Other Ambulatory Visit: Payer: Self-pay

## 2022-11-30 MED ORDER — ZOLPIDEM TARTRATE ER 12.5 MG PO TBCR
12.5000 mg | EXTENDED_RELEASE_TABLET | Freq: Every evening | ORAL | 0 refills | Status: DC | PRN
  Filled 2022-11-30: qty 30, 30d supply, fill #0

## 2022-11-30 NOTE — Telephone Encounter (Signed)
Last RX:08/26/22 Last OV:06/15/22 Next OV:12/14/22 UDS:04/28/21 CSC:04/28/21

## 2022-12-06 ENCOUNTER — Ambulatory Visit (INDEPENDENT_AMBULATORY_CARE_PROVIDER_SITE_OTHER): Payer: Commercial Managed Care - HMO | Admitting: Behavioral Health

## 2022-12-06 ENCOUNTER — Encounter: Payer: Self-pay | Admitting: Behavioral Health

## 2022-12-06 DIAGNOSIS — F411 Generalized anxiety disorder: Secondary | ICD-10-CM | POA: Diagnosis not present

## 2022-12-06 NOTE — Progress Notes (Signed)
   [facility] Counselor/Therapist Progress Note  Patient ID: Lindsey Burton, MRN: [id number],    Date: 12/06/2022  Time Spent: 58 minutes, 3:00 until 3:58 PM in person with the patient.  Treatment Type: Individual Therapy  Reported Symptoms: Anxiety, depression  Mental Status Exam: Appearance:  Well Groomed     Behavior: Appropriate  Motor: Normal  Speech/Language:  Clear and Coherent  Affect: Appropriate  Mood: normal  Thought process: normal  Thought content:   WNL  Sensory/Perceptual disturbances:   WNL  Orientation: oriented to person, place, time/date, situation, day of week, month of year, and year  Attention: Good  Concentration: Good  Memory: WNL  Fund of knowledge:  Good  Insight:   Good  Judgment:  Good  Impulse Control: Good   Risk Assessment: Danger to Self:  No Self-injurious Behavior: No Danger to Others: No Duty to Warn:no Physical Aggression / Violence:No  Access to Firearms a concern: No  Gang Involvement:No   Subjective: There have been some positives.  The patient's ex-husband did find a job and although it is temporary now that will become full-time soon.  He continues to spend time with the patient's daughter but knows that as the new job starts in the will be decreased travel that might change some.  The patient's job is still going well.  She is continuing to set good boundaries with family members especially.  She is trying to help her sister and niece some in housing search but can only do so much and feels that she is setting appropriate boundaries there.  She is doing the same thing with her mother and siblings as well as social contacts.  She reports feeling much less overwhelmed and knows that setting healthy boundaries has been good for her and sees that as good self-care.  She is getting consistent exercise which is good for her.  She is spending time with friends.   She feels that she is managing her anxiety level  well. The patient does contract for safety having no thoughts of hurting herself or anyone else.  Interventions: Cognitive Behavioral Therapy and Dialectical Behavioral Therapy  Diagnosis: Generalized anxiety disorder, major depressive disorder, recurrent, mild to moderate  Plan: I will meet with the patient every 2 to 3 weeks in person  [name], [facility]                 [name], [facility]               [name], [facility]        [name], [facility]      [name], [facility]

## 2022-12-14 ENCOUNTER — Ambulatory Visit (INDEPENDENT_AMBULATORY_CARE_PROVIDER_SITE_OTHER): Payer: Commercial Managed Care - HMO | Admitting: Family

## 2022-12-14 ENCOUNTER — Telehealth: Payer: Self-pay | Admitting: Family

## 2022-12-14 VITALS — BP 139/84 | HR 77 | Temp 98.6°F | Resp 16 | Wt 158.0 lb

## 2022-12-14 DIAGNOSIS — I10 Essential (primary) hypertension: Secondary | ICD-10-CM | POA: Diagnosis not present

## 2022-12-14 DIAGNOSIS — Z1211 Encounter for screening for malignant neoplasm of colon: Secondary | ICD-10-CM

## 2022-12-14 DIAGNOSIS — G4700 Insomnia, unspecified: Secondary | ICD-10-CM

## 2022-12-14 DIAGNOSIS — E785 Hyperlipidemia, unspecified: Secondary | ICD-10-CM

## 2022-12-14 DIAGNOSIS — E119 Type 2 diabetes mellitus without complications: Secondary | ICD-10-CM

## 2022-12-14 DIAGNOSIS — Z8639 Personal history of other endocrine, nutritional and metabolic disease: Secondary | ICD-10-CM | POA: Diagnosis not present

## 2022-12-14 DIAGNOSIS — J302 Other seasonal allergic rhinitis: Secondary | ICD-10-CM

## 2022-12-14 DIAGNOSIS — F419 Anxiety disorder, unspecified: Secondary | ICD-10-CM | POA: Diagnosis not present

## 2022-12-14 DIAGNOSIS — F32A Depression, unspecified: Secondary | ICD-10-CM

## 2022-12-14 LAB — MICROALBUMIN / CREATININE URINE RATIO
Creatinine,U: 253.4 mg/dL
Microalb Creat Ratio: 1 mg/g (ref 0.0–30.0)
Microalb, Ur: 2.6 mg/dL — ABNORMAL HIGH (ref 0.0–1.9)

## 2022-12-14 LAB — BASIC METABOLIC PANEL
BUN: 10 mg/dL (ref 6–23)
CO2: 29 mEq/L (ref 19–32)
Calcium: 9 mg/dL (ref 8.4–10.5)
Chloride: 104 mEq/L (ref 96–112)
Creatinine, Ser: 0.76 mg/dL (ref 0.40–1.20)
GFR: 92.2 mL/min (ref >60.00)
Glucose, Bld: 128 mg/dL — ABNORMAL HIGH (ref 70–99)
Potassium: 4.3 mEq/L (ref 3.5–5.1)
Sodium: 141 mEq/L (ref 135–145)

## 2022-12-14 LAB — LIPID PANEL
Cholesterol: 196 mg/dL (ref 0–200)
HDL: 36.8 mg/dL — ABNORMAL LOW (ref >39.00)
LDL Cholesterol: 132 mg/dL — ABNORMAL HIGH (ref 0–99)
NonHDL: 159.07
Total CHOL/HDL Ratio: 5
Triglycerides: 136 mg/dL (ref 0.0–149.0)
VLDL: 27.2 mg/dL (ref 0.0–40.0)

## 2022-12-14 LAB — TSH: TSH: 1.28 u[IU]/mL (ref 0.35–5.50)

## 2022-12-14 LAB — HEMOGLOBIN A1C: Hgb A1c MFr Bld: 6.6 % — ABNORMAL HIGH (ref 4.6–6.5)

## 2022-12-14 NOTE — Assessment & Plan Note (Signed)
Lab Results  Component Value Date   CHOL 206 (H) 03/30/2022   HDL 45.60 03/30/2022   LDLCALC 136 (H) 03/30/2022   TRIG 118.0 03/30/2022   CHOLHDL 5 03/30/2022  Continues lipitor, update lipid panel.

## 2022-12-14 NOTE — Assessment & Plan Note (Signed)
BP Readings from Last 3 Encounters:  12/14/22 139/84  06/15/22 134/89  05/16/22 (!) 163/97   At goal, continue amlodipine, hydrochlorothiazide and metoprolol.

## 2022-12-14 NOTE — Assessment & Plan Note (Signed)
Last A1C was 6.9. -Order A1C and urine microalbumin today. -Encourage annual eye exam. -Continue diabetic eye exam

## 2022-12-14 NOTE — Progress Notes (Signed)
Subjective:     Patient ID: Lindsey Burton, female    DOB: 1975/12/16, 47 y.o.   MRN: [id number]  Chief Complaint  Patient presents with   Hypertension    Here for follow up    Hypertension    Discussed the use of AI scribe software for clinical note transcription with the patient, who gave verbal consent to proceed.  History of Present Illness   The patient, with a history of hypertension, allergies, insomnia, and diabetes, presents for a routine follow up. She reports that her mood, depression, and anxiety have improved significantly with Lexapro and counseling, noting that therapy provides a beneficial outlet for her. She has been managing her allergies with over-the-counter medication and has not experienced any recent migraines. She has been adhering to her medication regimen for hypertension and her blood pressure readings have improved. She has not had any recent gynecological exams or colonoscopies. She also reports difficulty with insomnia, specifically with establishing a regular sleep schedule.      Lab Results  Component Value Date   HGBA1C 6.9 (H) 03/30/2022   HGBA1C 7.0 (H) 09/01/2021   HGBA1C 7.7 (H) 05/05/2021   Lab Results  Component Value Date   MICROALBUR 2.5 (H) 06/02/2021   LDLCALC 136 (H) 03/30/2022   CREATININE 0.73 03/30/2022       Health Maintenance Due  Topic Date Due   OPHTHALMOLOGY EXAM  Never done   Colonoscopy  Never done   PAP SMEAR-Modifier  12/17/2021   COVID-19 Vaccine (3 - 2023-24 season) 12/17/2021   Diabetic kidney evaluation - Urine ACR  06/02/2022   HEMOGLOBIN A1C  09/29/2022   INFLUENZA VACCINE  11/17/2022    Past Medical History:  Diagnosis Date   Alopecia    Getting kenalog injections prn   Anxiety    Depression    Hypertension    Insomnia    Migraines    Migraines    Vitamin D deficiency     Past Surgical History:  Procedure Laterality Date   LIPOSUCTION  09/2017    Family History  Problem Relation Age  of Onset   Hypertension Father    Diabetes Father    Hyperlipidemia Father    Heart attack Father    Diabetes Sister    Hypertension Sister    Cancer Neg Hx     Social History   Socioeconomic History   Marital status: Divorced    Spouse name: Not on file   Number of children: Not on file   Years of education: Not on file   Highest education level: Not on file  Occupational History   Not on file  Tobacco Use   Smoking status: Never   Smokeless tobacco: Never  Substance and Sexual Activity   Alcohol use: No   Drug use: No   Sexual activity: Yes    Partners: Male    Birth control/protection: Implant  Other Topics Concern   Not on file  Social History Narrative   Daughter, age 17 autistic Does well in school   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   Divorced, lives with daughter   Enjoys TV   Has fish   Social Determinants of Health   Financial Resource Strain: Not on file  Food Insecurity: Not on file  Transportation Needs: Not on file  Physical Activity: Not on file  Stress: Not on file  Social Connections: Unknown (08/27/2021)   [facility], [facility]  Social Network    Social Network: Not on file  Intimate Partner Violence: Unknown (07/19/2021)   Received from [facility], [facility]   HITS    Physically Hurt: Not on file    Insult or Talk Down To: Not on file    Threaten Physical Harm: Not on file    Scream or Curse: Not on file    Outpatient Medications Prior to Visit  Medication Sig Dispense Refill   amLODipine (NORVASC) 10 MG tablet Take 1 tablet (10 mg total) by mouth daily. 90 tablet 1   atorvastatin (LIPITOR) 20 MG tablet Take 1 tablet (20 mg total) by mouth daily. 90 tablet 1   betamethasone dipropionate 0.05 % cream Apply topically 2 (two) times daily. 30 g 0   blood glucose meter kit and supplies KIT Use up to four times daily as directed to check blood sugar 1 each 0   escitalopram (LEXAPRO) 20  MG tablet Take 1 tablet (20 mg total) by mouth daily. 90 tablet 1   fluticasone (FLONASE) 50 MCG/ACT nasal spray PLACE 2 SPRAYS INTO BOTH NOSTRILS DAILY 16 g 5   glucose blood (CONTOUR NEXT TEST) test strip Use up to 4 times a day as directed to check blood sugar 100 each 12   hydrochlorothiazide (HYDRODIURIL) 25 MG tablet Take 1 tablet (25 mg total) by mouth daily. 90 tablet 1   metoprolol succinate (TOPROL-XL) 100 MG 24 hr tablet Take 1 tablet (100 mg total) by mouth daily with or immediately following meals 90 tablet 1   Microlet Lancets MISC Use up to 4 times a day as directed to check blood sugar 100 each 11   Multiple Vitamins-Minerals (MULTIVITAMIN WITH MINERALS) tablet Take 1 tablet by mouth daily. 30 tablet    SUMAtriptan (IMITREX) 100 MG tablet TAKE ONE TABLET BY MOUTH AT START OF HEADACHE, MAY REPEAT 2 HOURS LATER AS NEEDED. MAX 2 DOSE PER 24 HOURS 10 tablet 5   Tazarotene (ARAZLO) 0.045 % LOTN Apply topically.     Triamcinolone Acetonide (KENALOG IJ) Inject 60 mg as directed. As needed or Every 1 1/2 months for Alopecia     zolpidem (AMBIEN CR) 12.5 MG CR tablet Take 1 tablet (12.5 mg total) by mouth at bedtime as needed. 30 tablet 0   etonogestrel (NEXPLANON) 68 MG IMPL implant 1 each (68 mg total) by Subdermal route once for 1 dose. 1 each 0   No facility-administered medications prior to visit.    Allergies  Allergen Reactions   Ace Inhibitors Swelling    angioedema    Lisinopril Swelling   Metformin And Related     ROS See HPI    Objective:    Physical Exam Constitutional:      General: She is not in acute distress.    Appearance: Normal appearance. She is well-developed.  HENT:     Head: Normocephalic and atraumatic.     Right Ear: External ear normal.     Left Ear: External ear normal.  Eyes:     General: No scleral icterus. Neck:     Thyroid: No thyromegaly.  Cardiovascular:     Rate and Rhythm: Normal rate and regular rhythm.     Heart sounds: Normal  heart sounds. No murmur heard. Pulmonary:     Effort: Pulmonary effort is normal. No respiratory distress.     Breath sounds: Normal breath sounds. No wheezing.  Musculoskeletal:     Cervical back: Neck supple.  Skin:    General: Skin  is warm and dry.  Neurological:     Mental Status: She is alert and oriented to person, place, and time.  Psychiatric:        Mood and Affect: Mood normal.        Behavior: Behavior normal.        Thought Content: Thought content normal.        Judgment: Judgment normal.      BP 139/84 (BP Location: Right Arm, Patient Position: Sitting, Cuff Size: Small)   Pulse 77   Temp 98.6 F (37 C) (Oral)   Resp 16   Wt 158 lb (71.7 kg)   SpO2 100%   BMI 28.90 kg/m  Wt Readings from Last 3 Encounters:  12/14/22 158 lb (71.7 kg)  06/15/22 160 lb (72.6 kg)  05/16/22 158 lb (71.7 kg)       Assessment & Plan:   Problem List Items Addressed This Visit       Unprioritized   Type 2 diabetes mellitus without complication, without long-term current use of insulin (HCC)    Last A1C was 6.9. -Order A1C and urine microalbumin today. -Encourage annual eye exam. -Continue diabetic eye exam      Relevant Orders   HgB A1c   Urine Microalbumin w/creat. ratio   Insomnia     Difficulty establishing a regular sleep schedule, but reports good sleep quality when taking Ambien. -Continue Ambien as prescribed. Encourage setting a regular sleep schedule. - obtain UDS - Controlled substance contract is signed today.      Relevant Orders   DRUG MONITORING, PANEL 8 WITH CONFIRMATION, URINE   Hyperlipidemia    Lab Results  Component Value Date   CHOL 206 (H) 03/30/2022   HDL 45.60 03/30/2022   LDLCALC 136 (H) 03/30/2022   TRIG 118.0 03/30/2022   CHOLHDL 5 03/30/2022  Continues lipitor, update lipid panel.        Relevant Orders   Lipid panel   History of hyperthyroidism    Lab Results  Component Value Date   TSH 0.84 09/01/2021         Relevant  Orders   TSH   Essential hypertension    BP Readings from Last 3 Encounters:  12/14/22 139/84  06/15/22 134/89  05/16/22 (!) 163/97   At goal, continue amlodipine, hydrochlorothiazide and metoprolol.       Relevant Orders   Basic Metabolic Panel (BMET)   Anxiety and depression - Primary    Stable, continue lexapro and counseling.       Allergic rhinitis    Takes otc generic antihistamine      Other Visit Diagnoses     Screening for colon cancer       Relevant Orders   Ambulatory referral to Gastroenterology       I am having Noralyn Pick maintain her Triamcinolone Acetonide (KENALOG IJ), multivitamin with minerals, Arazlo, Nexplanon, Contour Next Test, Microlet Lancets, fluticasone, SUMAtriptan, blood glucose meter kit and supplies, betamethasone dipropionate, metoprolol succinate, amLODipine, escitalopram, atorvastatin, hydrochlorothiazide, and zolpidem.  No orders of the defined types were placed in this encounter.

## 2022-12-14 NOTE — Assessment & Plan Note (Signed)
Takes otc generic antihistamine

## 2022-12-14 NOTE — Assessment & Plan Note (Signed)
Lab Results  Component Value Date   TSH 0.84 09/01/2021

## 2022-12-14 NOTE — Assessment & Plan Note (Signed)
Stable, continue lexapro and counseling.

## 2022-12-14 NOTE — Telephone Encounter (Signed)
Electronic request made 

## 2022-12-14 NOTE — Assessment & Plan Note (Signed)
  Difficulty establishing a regular sleep schedule, but reports good sleep quality when taking Ambien. -Continue Ambien as prescribed. Encourage setting a regular sleep schedule. - obtain UDS - Controlled substance contract is signed today.

## 2022-12-14 NOTE — Telephone Encounter (Signed)
Please request DM eye exam from [facility].

## 2022-12-15 ENCOUNTER — Other Ambulatory Visit: Payer: Self-pay | Admitting: Family

## 2022-12-15 DIAGNOSIS — E119 Type 2 diabetes mellitus without complications: Secondary | ICD-10-CM

## 2022-12-15 LAB — DM TEMPLATE

## 2022-12-15 LAB — DRUG MONITORING, PANEL 8 WITH CONFIRMATION, URINE
6 Acetylmorphine: NEGATIVE ng/mL (ref <10)
Alcohol Metabolites: NEGATIVE ng/mL (ref <500)
Amphetamines: NEGATIVE ng/mL (ref <500)
Benzodiazepines: NEGATIVE ng/mL (ref <100)
Buprenorphine, Urine: NEGATIVE ng/mL (ref <5)
Cocaine Metabolite: NEGATIVE ng/mL (ref <150)
Creatinine: 266.9 mg/dL (ref >=20.0)
MDMA: NEGATIVE ng/mL (ref <500)
Marijuana Metabolite: NEGATIVE ng/mL (ref <20)
Opiates: NEGATIVE ng/mL (ref <100)
Oxidant: NEGATIVE ug/mL (ref <200)
Oxycodone: NEGATIVE ng/mL (ref <100)
pH: 5.7 (ref 4.5–9.0)

## 2022-12-15 NOTE — Telephone Encounter (Signed)
Cholesterol is slightly above goal still.  I would like her to increase atorvastatin from 20mg  to 40mg .  Not sure if she wants mail order or local pharmacy.   A1C has come down and remains at goal.  There is some protein in her urine.  This is likely due to her history of high blood pressure and diabetes. Strict sugar and blood pressure control are important to reduce further stress/damage of her kidneys.

## 2022-12-16 ENCOUNTER — Other Ambulatory Visit (HOSPITAL_BASED_OUTPATIENT_CLINIC_OR_DEPARTMENT_OTHER): Payer: Self-pay

## 2022-12-16 IMAGING — MG MM DIGITAL SCREENING BILAT W/ TOMO AND CAD
8 series · 8 of 24 positions shown · non-contrast
Comparison: Previous exam(s).

CLINICAL DATA: Screening.

EXAM:
DIGITAL SCREENING BILATERAL MAMMOGRAM WITH TOMOSYNTHESIS AND CAD
TECHNIQUE: Bilateral screening digital craniocaudal and mediolateral oblique
mammograms were obtained. Bilateral screening digital breast
tomosynthesis was performed. The images were evaluated with
computer-aided detection.

[R CC synth-2D]
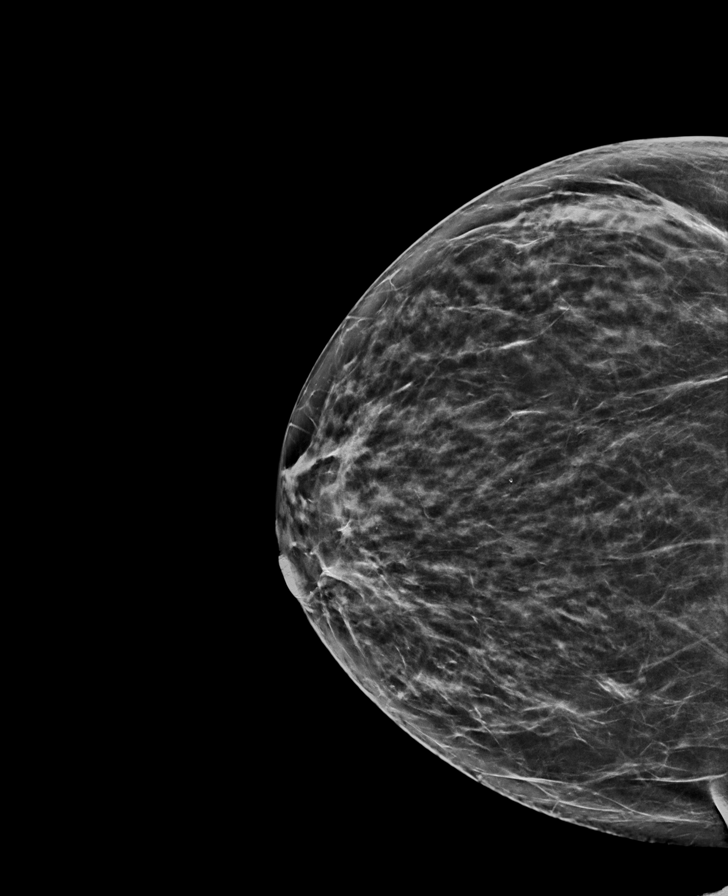

[L MLO synth-2D]
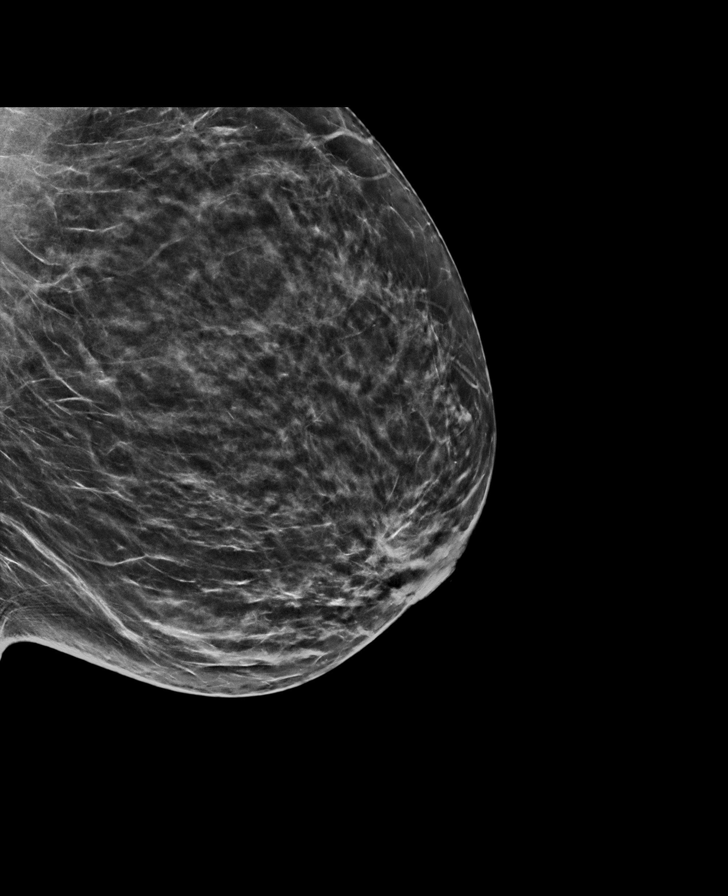

[R MLO synth-2D]
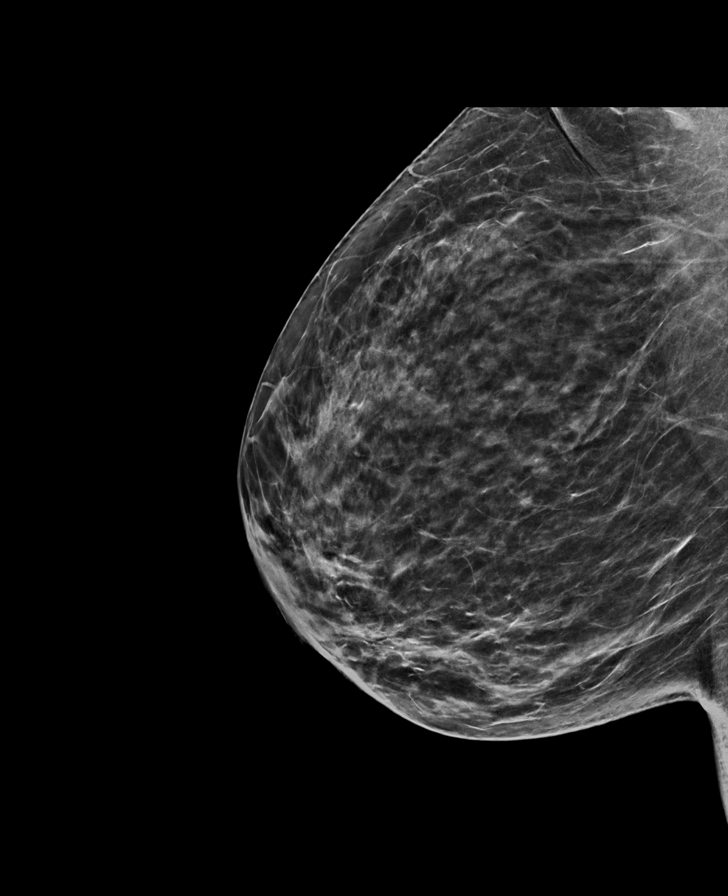

[L CC synth-2D]
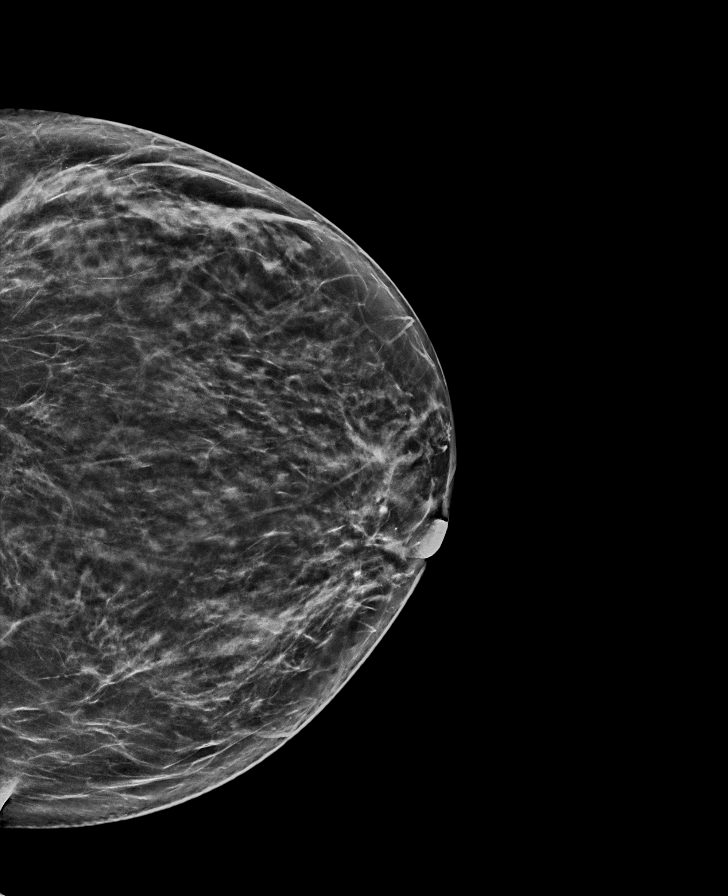

[L MLO tomo · tomo slice 34/67.0]
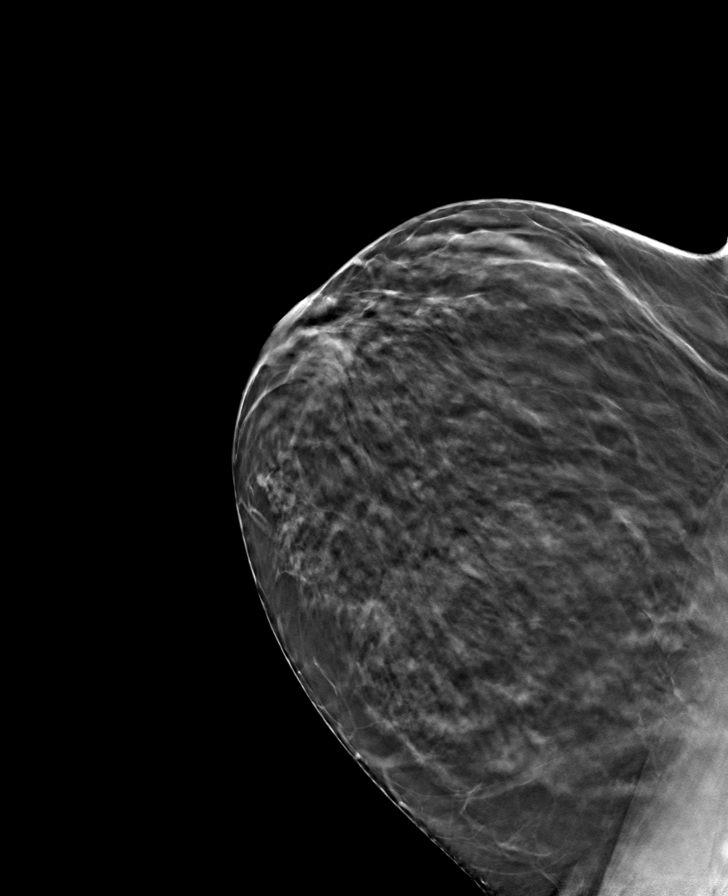

[L CC tomo · tomo slice 32/63.0]
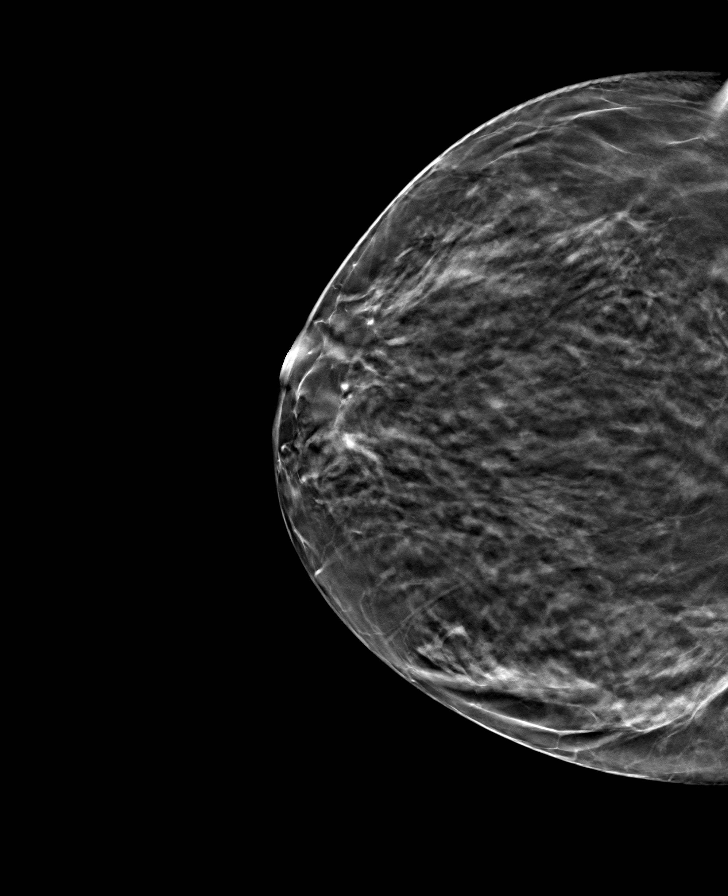

[R MLO tomo · tomo slice 35/69.0]
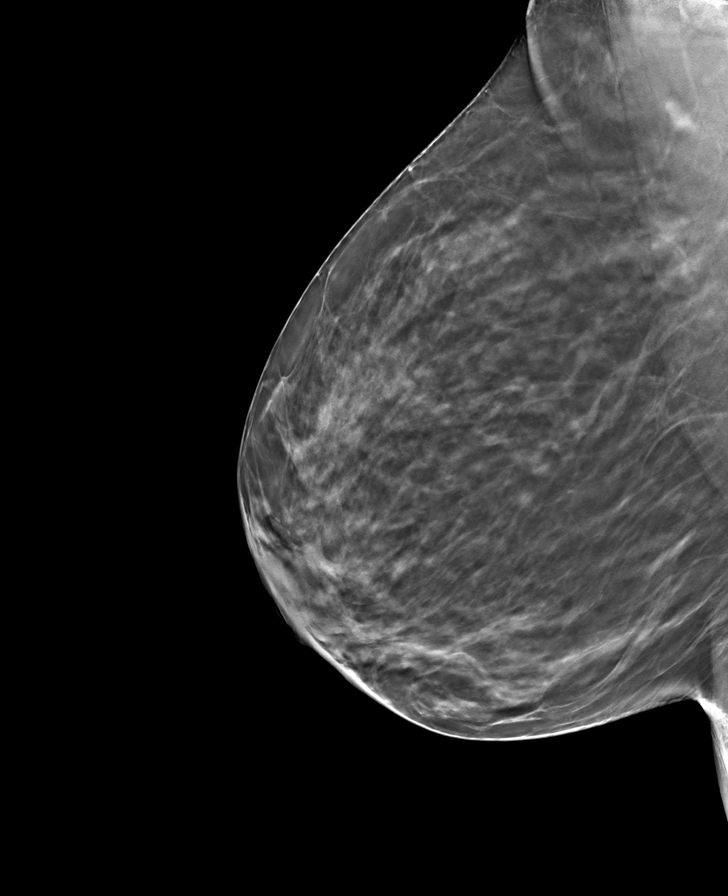

[R CC tomo · tomo slice 33/65.0]
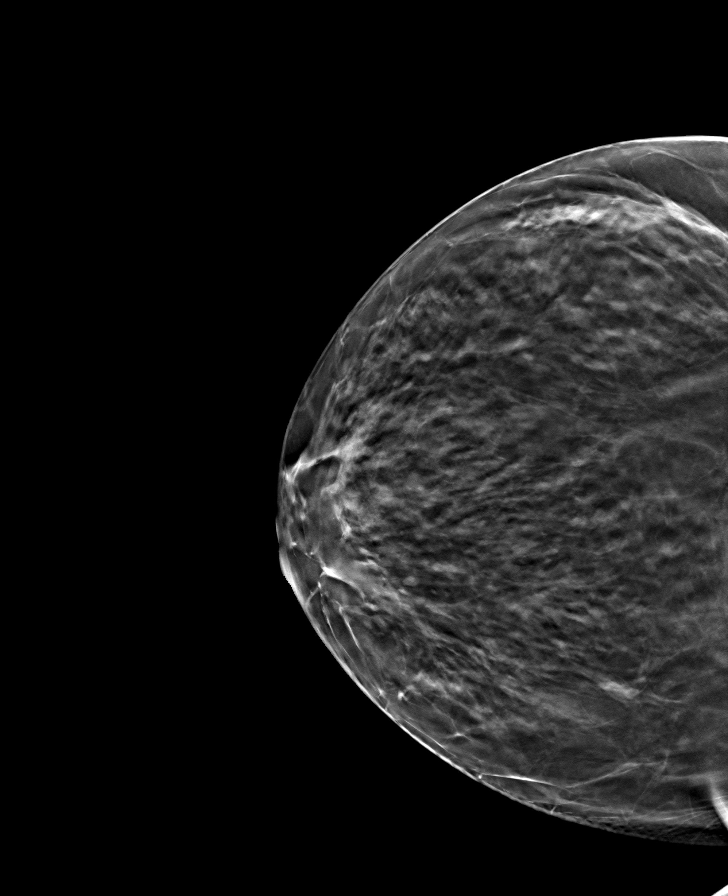

[8 of 24 positions shown; findings below may reference images not displayed]

ACR Breast Density Category b: There are scattered areas of
fibroglandular density.
FINDINGS: There are no findings suspicious for malignancy.
IMPRESSION: No mammographic evidence of malignancy. A result letter of this
screening mammogram will be mailed directly to the patient.

RECOMMENDATION:
Screening mammogram in one year. (Code:51-O-LD2)

BI-RADS CATEGORY  1: Negative.

## 2022-12-16 MED ORDER — ATORVASTATIN CALCIUM 40 MG PO TABS
40.0000 mg | ORAL_TABLET | Freq: Every day | ORAL | 1 refills | Status: DC
  Filled 2022-12-16: qty 90, 90d supply, fill #0

## 2022-12-16 NOTE — Telephone Encounter (Signed)
Also, it looks like her last eye exam was in 2022, so I have placed a referral to schedule an updated exam.

## 2022-12-20 ENCOUNTER — Ambulatory Visit (INDEPENDENT_AMBULATORY_CARE_PROVIDER_SITE_OTHER): Payer: Commercial Managed Care - HMO | Admitting: Behavioral Health

## 2022-12-20 ENCOUNTER — Encounter: Payer: Self-pay | Admitting: Behavioral Health

## 2022-12-20 DIAGNOSIS — F411 Generalized anxiety disorder: Secondary | ICD-10-CM

## 2022-12-20 NOTE — Progress Notes (Signed)
   [facility] Counselor/Therapist Progress Note  Patient ID: Lindsey Burton, MRN: [id number],    Date: 12/20/2022  Time Spent: 58 minutes, 3:00 until 3:58 PM in person with the patient.  Treatment Type: Individual Therapy  Reported Symptoms: Anxiety, depression  Mental Status Exam: Appearance:  Well Groomed     Behavior: Appropriate  Motor: Normal  Speech/Language:  Clear and Coherent  Affect: Appropriate  Mood: normal  Thought process: normal  Thought content:   WNL  Sensory/Perceptual disturbances:   WNL  Orientation: oriented to person, place, time/date, situation, day of week, month of year, and year  Attention: Good  Concentration: Good  Memory: WNL  Fund of knowledge:  Good  Insight:   Good  Judgment:  Good  Impulse Control: Good   Risk Assessment: Danger to Self:  No Self-injurious Behavior: No Danger to Others: No Duty to Warn:no Physical Aggression / Violence:No  Access to Firearms a concern: No  Gang Involvement:No   Subjective: The patient has been watching in [place] because her family members tell her she reminds him of a character bath on that show.  She sees that characters very straightforward which she sees in herself but said it made her questioning whether or not she needs to do something differently.  That her family dynamics including with her mother and siblings and how each of them fits in that family role.  Each of them had their own way of avoiding decision making and conflict with the exception of the patient who face the head on and directly.  She knows they look to her for authority and research but also knows that may come with commentary.  Talked about how their family dynamics works in terms of how they interact with each other.  As we processed the more does not feel that she has any regrets about the way she interacts with them but we did talk about setting a secondary boundaries in terms of what she is willing to do to help  family members not feeling the role of the 1 who does things because the others can be avoidant. She feels that she is managing her anxiety level well. The patient does contract for safety having no thoughts of hurting herself or anyone else.  Interventions: Cognitive Behavioral Therapy and Dialectical Behavioral Therapy  Diagnosis: Generalized anxiety disorder, major depressive disorder, recurrent, mild to moderate  Plan: I will meet with the patient every 2 to 3 weeks in person  [name], [facility]                 [name], [facility]               [name], [facility]        [name], [facility]      [name], [facility]               [name], [facility]

## 2022-12-20 NOTE — Telephone Encounter (Signed)
Patient notified of refill and referral.

## 2023-01-03 ENCOUNTER — Other Ambulatory Visit (HOSPITAL_BASED_OUTPATIENT_CLINIC_OR_DEPARTMENT_OTHER): Payer: Self-pay

## 2023-01-03 ENCOUNTER — Encounter: Payer: Self-pay | Admitting: Behavioral Health

## 2023-01-03 ENCOUNTER — Ambulatory Visit (INDEPENDENT_AMBULATORY_CARE_PROVIDER_SITE_OTHER): Payer: Commercial Managed Care - HMO | Admitting: Behavioral Health

## 2023-01-03 DIAGNOSIS — F411 Generalized anxiety disorder: Secondary | ICD-10-CM

## 2023-01-03 NOTE — Progress Notes (Signed)
[facility] Counselor/Therapist Progress Note  Patient ID: Lindsey Burton, MRN: [id number],    Date: 01/03/2023  Time Spent: 58 minutes, 3:00 until 3:58 PM in person with the patient.  Treatment Type: Individual Therapy  Reported Symptoms: Anxiety, depression  Mental Status Exam: Appearance:  Well Groomed     Behavior: Appropriate  Motor: Normal  Speech/Language:  Clear and Coherent  Affect: Appropriate  Mood: normal  Thought process: normal  Thought content:   WNL  Sensory/Perceptual disturbances:   WNL  Orientation: oriented to person, place, time/date, situation, day of week, month of year, and year  Attention: Good  Concentration: Good  Memory: WNL  Fund of knowledge:  Good  Insight:   Good  Judgment:  Good  Impulse Control: Good   Risk Assessment: Danger to Self:  No Self-injurious Behavior: No Danger to Others: No Duty to Warn:no Physical Aggression / Violence:No  Access to Firearms a concern: No  Gang Involvement:No   Subjective: The patient cited several examples where she instead of instinctively agreeing to do things for someone respectfully pointed out how they could do that especially in relation to family members.  She described it as very freeing and empowering.  It was a healthy boundary which had positive impact on her in several ways.  She still recognizes that she wants to help other people but also does not want to get enmeshed in doing things for people that they can do themselves.  In a conversation with her brother he pointed out his observations about how she interacts with other people but especially at what he observes about the patient and her daughter interacting.  She says her daughter has always been very compliant and done what she said.  She says she has never had to raise her voice with her get angry with her daughter but she has always told her daughter to learn to have her own voice.  We talked about what their interactions  look like and how they communicate with each other.  Homework ask her to try several different things with her daughter including being able to let her daughter for example fold laundry in a way that her daughter does that as opposed to the way the patient wants it done and find ways to encourage that even if it is not exactly the way she likes it.  She wants her daughter to have her own voice and feel empowered to make decisions on her own.  She says her daughter gets frustrated with the way her ex-husband speaks to her and she is trying to get her to be assertive in that relationship also.  The patient does contract for safety having no thoughts of hurting herself or anyone else.  Interventions: Cognitive Behavioral Therapy and Dialectical Behavioral Therapy  Diagnosis: Generalized anxiety disorder, major depressive disorder, recurrent, mild to moderate  Plan: I will meet with the patient every 2 to 3 weeks in person Target date: January 16, 2023.  I did discuss the progress that the patient is making things going very well.  She is setting clear boundaries.  She is using anxiety reduction techniques.  She is practicing good self-care.  She would like to continue goals as stated with a new target date of July 17, 2023.  Progress: 40%.  [name], [facility]                 [name], [facility]  [name], [facility]        [name], [facility]      [name], [facility]               [name], [facility]               [name], [facility]

## 2023-01-09 ENCOUNTER — Other Ambulatory Visit (HOSPITAL_BASED_OUTPATIENT_CLINIC_OR_DEPARTMENT_OTHER): Payer: Self-pay

## 2023-01-17 ENCOUNTER — Encounter: Payer: Self-pay | Admitting: Behavioral Health

## 2023-01-17 ENCOUNTER — Ambulatory Visit (INDEPENDENT_AMBULATORY_CARE_PROVIDER_SITE_OTHER): Payer: 59 | Admitting: Behavioral Health

## 2023-01-17 DIAGNOSIS — F411 Generalized anxiety disorder: Secondary | ICD-10-CM

## 2023-01-17 DIAGNOSIS — F331 Major depressive disorder, recurrent, moderate: Secondary | ICD-10-CM | POA: Diagnosis not present

## 2023-01-17 NOTE — Progress Notes (Signed)
[facility] Counselor/Therapist Progress Note  Patient ID: Lindsey Burton, MRN: [id number],    Date: 01/17/2023  Time Spent: 58 minutes, 3:00 until 3:58 PM in person with the patient.  Treatment Type: Individual Therapy  Reported Symptoms: Anxiety, depression  Mental Status Exam: Appearance:  Well Groomed     Behavior: Appropriate  Motor: Normal  Speech/Language:  Clear and Coherent  Affect: Appropriate  Mood: normal  Thought process: normal  Thought content:   WNL  Sensory/Perceptual disturbances:   WNL  Orientation: oriented to person, place, time/date, situation, day of week, month of year, and year  Attention: Good  Concentration: Good  Memory: WNL  Fund of knowledge:  Good  Insight:   Good  Judgment:  Good  Impulse Control: Good   Risk Assessment: Danger to Self:  No Self-injurious Behavior: No Danger to Others: No Duty to Warn:no Physical Aggression / Violence:No  Access to Firearms a concern: No  Gang Involvement:No   Subjective: The patient says that recently it has been much more peaceful around her home and she is enjoying that.  Part of it comes from her setting some fairly clearly defined boundaries.  Her sister brother still call consistently but she is trying to stretch out the amount of time in between calls.  She said that she had set a boundary for 2 hours taking a carpenter without going back with family members and that is going fairly well but she thought about stretching into 4 hours and that made it tough because the Longer covered up.  She has any at work that we ran session she had received 6 phone calls from family members or friends or guys who she is not interested in talking to.  She continues to set boundaries in terms of allowing them to make decisions for themselves instead of writing them through her but knows that to work in process.  We talked about what the balance looks like between maintaining relationships in his family  but also setting boundaries so that she can take care of herself.  She also has decided that she just is not going to date right now because guys do not really want to take more before they wanted to become more than that and she is not going to tolerate that. The patient does contract for safety having no thoughts of hurting herself or anyone else.  Interventions: Cognitive Behavioral Therapy and Dialectical Behavioral Therapy  She is looking forward to some time away with some friends and family members for a few days and then come back to go on a trip to celebrate a friend's 50th birthday.  Diagnosis: Generalized anxiety disorder, major depressive disorder, recurrent, mild to moderate  Plan: I will meet with the patient every 2 to 3 weeks in person Target date: January 16, 2023.  I did discuss the progress that the patient is making things going very well.  She is setting clear boundaries.  She is using anxiety reduction techniques.  She is practicing good self-care.  She would like to continue goals as stated with a new target date of July 17, 2023.  Progress: 40%.   [name], [place]                 [name], [facility]               [name], [facility]        [name], [facility]      Tasia Catchings  Antonieta Loveless, [facility]               [name], [facility]               [name], [facility]               [name], [facility]

## 2023-01-26 ENCOUNTER — Other Ambulatory Visit: Payer: Self-pay | Admitting: Family

## 2023-01-26 ENCOUNTER — Other Ambulatory Visit (HOSPITAL_BASED_OUTPATIENT_CLINIC_OR_DEPARTMENT_OTHER): Payer: Self-pay

## 2023-01-26 MED ORDER — ZOLPIDEM TARTRATE ER 12.5 MG PO TBCR
12.5000 mg | EXTENDED_RELEASE_TABLET | Freq: Every evening | ORAL | 0 refills | Status: DC | PRN
  Filled 2023-01-26: qty 30, 30d supply, fill #0

## 2023-02-14 ENCOUNTER — Ambulatory Visit: Payer: Commercial Managed Care - HMO | Admitting: Behavioral Health

## 2023-02-23 ENCOUNTER — Other Ambulatory Visit (HOSPITAL_BASED_OUTPATIENT_CLINIC_OR_DEPARTMENT_OTHER): Payer: Self-pay

## 2023-02-27 ENCOUNTER — Encounter: Payer: Self-pay | Admitting: Behavioral Health

## 2023-02-27 ENCOUNTER — Ambulatory Visit: Payer: 59 | Admitting: Behavioral Health

## 2023-02-27 DIAGNOSIS — F411 Generalized anxiety disorder: Secondary | ICD-10-CM | POA: Diagnosis not present

## 2023-02-27 DIAGNOSIS — F331 Major depressive disorder, recurrent, moderate: Secondary | ICD-10-CM | POA: Diagnosis not present

## 2023-02-27 NOTE — Progress Notes (Signed)
[facility] Counselor/Therapist Progress Note  Patient ID: Lindsey Burton, MRN: [id number],    Date: 02/27/2023  Time Spent: 58 minutes, 3:00 until 3:58 PM in person with the patient.  Treatment Type: Individual Therapy  Reported Symptoms: Anxiety, depression  Mental Status Exam: Appearance:  Well Groomed     Behavior: Appropriate  Motor: Normal  Speech/Language:  Clear and Coherent  Affect: Appropriate  Mood: normal  Thought process: normal  Thought content:   WNL  Sensory/Perceptual disturbances:   WNL  Orientation: oriented to person, place, time/date, situation, day of week, month of year, and year  Attention: Good  Concentration: Good  Memory: WNL  Fund of knowledge:  Good  Insight:   Good  Judgment:  Good  Impulse Control: Good   Risk Assessment: Danger to Self:  No Self-injurious Behavior: No Danger to Others: No Duty to Warn:no Physical Aggression / Violence:No  Access to Firearms a concern: No  Gang Involvement:No   Subjective: The patient feels that she is still setting good boundaries and is okay with that.  She did have to reset a boundary with her sister but is sticking to the original agreement with sober additional stipulations.  She is gradually reducing the amount of time that she is on the phone with family members limiting it to 30 to 40 minutes feeling that is enough time to cover anything that they need to.  She is setting some boundaries at work and not taking on any extra work that she does not feel that she can handle.  She is going out some with friends.  She took a weekend trip as well as a 5-day all-inclusive trip to [place] for someone's 50th birthday and had a great time.  When she is out she says she is polite but is setting very firm boundaries with people who are interested in connecting with her.  She said she is happy to have conversations with people who want to make or have common or reciprocal goals but is having a  difficult time finding people like that so until she does she knows that she is very self-sufficient and pretty happy with where her life is.  Her daughter is spending consistent time with her ex-husband.  He is working out of town during the week but is making an effort to spend time with their daughter on weekends.  Her daughter is still working at the same place but is interested in pursuing a different job within that organization.  She continues to encouraged her daughter to look at school options but has left that to the daughter and ex-husband to pursue that more assertively.  The patient does contract for safety having no thoughts of hurting herself or anyone else.  Interventions: Cognitive Behavioral Therapy and Dialectical Behavioral Therapy  She is looking forward to some time away with some friends and family members for a few days and then come back to go on a trip to celebrate a friend's 50th birthday.  Diagnosis: Generalized anxiety disorder, major depressive disorder, recurrent, mild to moderate  Plan: I will meet with the patient every 2 to 3 weeks in person Target date: January 16, 2023.  I did discuss the progress that the patient is making things going very well.  She is setting clear boundaries.  She is using anxiety reduction techniques.  She is practicing good self-care.  She would like to continue goals as stated with a new target date of July 17, 2023.  Progress: 40%.  [name], [facility]                 [name], [facility]               [name], [facility]        [name], [facility]      [name], [facility]               [name], [facility]               [name], [facility]               [name], [facility]               [name], [facility]

## 2023-03-08 ENCOUNTER — Encounter: Payer: Commercial Managed Care - HMO | Admitting: Family

## 2023-03-20 ENCOUNTER — Ambulatory Visit: Payer: 59 | Admitting: Behavioral Health

## 2023-03-28 ENCOUNTER — Encounter: Payer: Self-pay | Admitting: Behavioral Health

## 2023-03-28 ENCOUNTER — Ambulatory Visit: Payer: Commercial Managed Care - HMO | Admitting: Behavioral Health

## 2023-03-28 DIAGNOSIS — F411 Generalized anxiety disorder: Secondary | ICD-10-CM | POA: Diagnosis not present

## 2023-03-28 DIAGNOSIS — F331 Major depressive disorder, recurrent, moderate: Secondary | ICD-10-CM

## 2023-03-28 NOTE — Progress Notes (Signed)
[facility] Counselor/Therapist Progress Note  Patient ID: Lindsey Burton, MRN: [id number],    Date: 03/28/2023  Time Spent: 56 minutes, 4:00 until 4:56 PM in person with the patient.  Treatment Type: Individual Therapy  Reported Symptoms: Anxiety, depression  Mental Status Exam: Appearance:  Well Groomed     Behavior: Appropriate  Motor: Normal  Speech/Language:  Clear and Coherent  Affect: Appropriate  Mood: normal  Thought process: normal  Thought content:   WNL  Sensory/Perceptual disturbances:   WNL  Orientation: oriented to person, place, time/date, situation, day of week, month of year, and year  Attention: Good  Concentration: Good  Memory: WNL  Fund of knowledge:  Good  Insight:   Good  Judgment:  Good  Impulse Control: Good   Risk Assessment: Danger to Self:  No Self-injurious Behavior: No Danger to Others: No Duty to Warn:no Physical Aggression / Violence:No  Access to Firearms a concern: No  Gang Involvement:No   Subjective: At Thanksgiving the patient posted a lot of family members in addition to cooking most of the food.  She said that she and her brother alternate Thanksgiving Christmas and nobody else post or brings in food.  After this Thanksgiving she and her brother are going to set very clear boundaries saying beyond this Christmas they can no longer host because the patient says she is still recovering from all the work that she had to do in advance of and afterward.  She continues to set good boundaries but said especially with family members including sister and mother they are not respecting the boundaries very well especially in terms of phone calls.  We looked at different ways which she could be very clear about what her boundary is and I suggested limiting receiving phone calls to a minimal number unless it is something significant with their health or safety.  The patient is being very intentional about scheduling more time away  next year for long weekends especially when it is her daughter's weekend with her daughter's father.  The patient has times here and she is already blocked off 3 long weekends from work at the first part of next year.  I encouraged her to continue that and look for other ways especially to set boundaries with family.  The patient does contract for safety having no thoughts of hurting herself or anyone else.  Interventions: Cognitive Behavioral Therapy and Dialectical Behavioral Therapy  She is looking forward to some time away with some friends and family members for a few days and then come back to go on a trip to celebrate a friend's 50th birthday.  Diagnosis: Generalized anxiety disorder, major depressive disorder, recurrent, mild to moderate  Plan: I will meet with the patient every 2 to 3 weeks in person Target date: January 16, 2023.  I did discuss the progress that the patient is making things going very well.  She is setting clear boundaries.  She is using anxiety reduction techniques.  She is practicing good self-care.  She would like to continue goals as stated with a new target date of July 17, 2023.  Progress: 40%.   [name], [facility]                 [name], [facility]               [name], [facility]        [name], [facility]      [name],  [facility]               [name], [facility]               [name], [facility]               [name], [facility]               [name], [facility]               [name], [facility]

## 2023-03-29 ENCOUNTER — Other Ambulatory Visit (HOSPITAL_BASED_OUTPATIENT_CLINIC_OR_DEPARTMENT_OTHER): Payer: Self-pay

## 2023-03-29 ENCOUNTER — Ambulatory Visit: Payer: Commercial Managed Care - HMO | Admitting: Family

## 2023-03-29 VITALS — BP 165/85 | HR 72 | Temp 98.8°F | Resp 16 | Ht 64.0 in | Wt 162.0 lb

## 2023-03-29 DIAGNOSIS — E119 Type 2 diabetes mellitus without complications: Secondary | ICD-10-CM

## 2023-03-29 DIAGNOSIS — F32A Depression, unspecified: Secondary | ICD-10-CM

## 2023-03-29 DIAGNOSIS — G4700 Insomnia, unspecified: Secondary | ICD-10-CM

## 2023-03-29 DIAGNOSIS — Z87442 Personal history of urinary calculi: Secondary | ICD-10-CM | POA: Diagnosis not present

## 2023-03-29 DIAGNOSIS — E785 Hyperlipidemia, unspecified: Secondary | ICD-10-CM | POA: Diagnosis not present

## 2023-03-29 DIAGNOSIS — F419 Anxiety disorder, unspecified: Secondary | ICD-10-CM

## 2023-03-29 DIAGNOSIS — L659 Nonscarring hair loss, unspecified: Secondary | ICD-10-CM | POA: Diagnosis not present

## 2023-03-29 DIAGNOSIS — G43909 Migraine, unspecified, not intractable, without status migrainosus: Secondary | ICD-10-CM

## 2023-03-29 DIAGNOSIS — Z1211 Encounter for screening for malignant neoplasm of colon: Secondary | ICD-10-CM

## 2023-03-29 DIAGNOSIS — I10 Essential (primary) hypertension: Secondary | ICD-10-CM

## 2023-03-29 MED ORDER — ZOLPIDEM TARTRATE ER 12.5 MG PO TBCR
12.5000 mg | EXTENDED_RELEASE_TABLET | Freq: Every evening | ORAL | 0 refills | Status: DC | PRN
  Filled 2023-03-29: qty 30, 30d supply, fill #0

## 2023-03-29 MED ORDER — CARVEDILOL 25 MG PO TABS
25.0000 mg | ORAL_TABLET | Freq: Two times a day (BID) | ORAL | 3 refills | Status: AC
  Filled 2023-03-29: qty 60, 30d supply, fill #0
  Filled 2023-07-04: qty 60, 30d supply, fill #1

## 2023-03-29 NOTE — Assessment & Plan Note (Addendum)
Dermatology is requesting iron studies. Pt thinks stress is playing a role.

## 2023-03-29 NOTE — Progress Notes (Signed)
Subjective:     Patient ID: Lindsey Burton, female    DOB: 12/09/1975, 47 y.o.   MRN: [id number]  Chief Complaint  Patient presents with   Hypertension    Here for follow up   Anxiety    Here for follow up    Discussed the use of AI scribe software for clinical note transcription with the patient, who gave verbal consent to proceed.  History of Present Illness   The patient presents for follow-up and diabetes management. She reports feeling like she has "fallen off the wagon" recently, particularly due to the stress and dietary challenges associated with hosting Thanksgiving. Despite this, she has a plan to reduce her sugar intake to 50 grams a day starting in 2025, acknowledging that sugar is a significant problem in her diet.  The patient also discusses her history of migraines, which she notes have improved significantly. She attributes this improvement to the Nexplanon contraceptive implant, as her migraines typically occurred during her menstrual cycle. She has Imitrex available if needed.  The patient also mentions sleep issues, stating that she has been waking up at 3:30 or 4:00 AM for the past couple of weeks. She attributes this to stress and takes Ambien as needed.          Health Maintenance Due  Topic Date Due   OPHTHALMOLOGY EXAM  Never done   Colonoscopy  Never done   COVID-19 Vaccine (3 - 2023-24 season) 12/18/2022    Past Medical History:  Diagnosis Date   Alopecia    Getting kenalog injections prn   Anxiety    Depression    Hypertension    Insomnia    Migraines    Migraines    Vitamin D deficiency     Past Surgical History:  Procedure Laterality Date   LIPOSUCTION  09/2017    Family History  Problem Relation Age of Onset   Hypertension Father    Diabetes Father    Hyperlipidemia Father    Heart attack Father    Diabetes Sister    Hypertension Sister    Cancer Neg Hx     Social History   Socioeconomic History   Marital status:  Divorced    Spouse name: Not on file   Number of children: Not on file   Years of education: Not on file   Highest education level: Not on file  Occupational History   Not on file  Tobacco Use   Smoking status: Never   Smokeless tobacco: Never  Substance and Sexual Activity   Alcohol use: No   Drug use: No   Sexual activity: Yes    Partners: Male    Birth control/protection: Implant  Other Topics Concern   Not on file  Social History Narrative   Daughter, age 94 autistic Does well in school   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   Divorced, lives with daughter   Enjoys TV   Has fish   Social Determinants of Health   Financial Resource Strain: Not on file  Food Insecurity: Not on file  Transportation Needs: Not on file  Physical Activity: Not on file  Stress: Not on file  Social Connections: Unknown (08/27/2021)   [facility], [facility]   Social Network    Social Network: Not on file  Intimate Partner Violence: Unknown (07/19/2021)   [facility], [facility]   HITS    Physically Hurt: Not on file  Insult or Talk Down To: Not on file    Threaten Physical Harm: Not on file    Scream or Curse: Not on file    Outpatient Medications Prior to Visit  Medication Sig Dispense Refill   amLODipine (NORVASC) 10 MG tablet Take 1 tablet (10 mg total) by mouth daily. 90 tablet 1   atorvastatin (LIPITOR) 40 MG tablet Take 1 tablet (40 mg total) by mouth daily. 90 tablet 1   betamethasone dipropionate 0.05 % cream Apply topically 2 (two) times daily. 30 g 0   blood glucose meter kit and supplies KIT Use up to four times daily as directed to check blood sugar 1 each 0   escitalopram (LEXAPRO) 20 MG tablet Take 1 tablet (20 mg total) by mouth daily. 90 tablet 1   etonogestrel (NEXPLANON) 68 MG IMPL implant 1 each (68 mg total) by Subdermal route once for 1 dose. 1 each 0   fluticasone (FLONASE) 50 MCG/ACT nasal spray  PLACE 2 SPRAYS INTO BOTH NOSTRILS DAILY 16 g 5   glucose blood (CONTOUR NEXT TEST) test strip Use up to 4 times a day as directed to check blood sugar 100 each 12   hydrochlorothiazide (HYDRODIURIL) 25 MG tablet Take 1 tablet (25 mg total) by mouth daily. 90 tablet 1   Microlet Lancets MISC Use up to 4 times a day as directed to check blood sugar 100 each 11   Multiple Vitamins-Minerals (MULTIVITAMIN WITH MINERALS) tablet Take 1 tablet by mouth daily. 30 tablet    SUMAtriptan (IMITREX) 100 MG tablet TAKE ONE TABLET BY MOUTH AT START OF HEADACHE, MAY REPEAT 2 HOURS LATER AS NEEDED. MAX 2 DOSE PER 24 HOURS 10 tablet 5   Tazarotene (ARAZLO) 0.045 % LOTN Apply topically.     Triamcinolone Acetonide (KENALOG IJ) Inject 60 mg as directed. As needed or Every 1 1/2 months for Alopecia     metoprolol succinate (TOPROL-XL) 100 MG 24 hr tablet Take 1 tablet (100 mg total) by mouth daily with or immediately following meals 90 tablet 1   zolpidem (AMBIEN CR) 12.5 MG CR tablet Take 1 tablet (12.5 mg total) by mouth at bedtime as needed. 30 tablet 0   No facility-administered medications prior to visit.    Allergies  Allergen Reactions   Ace Inhibitors Swelling    angioedema    Lisinopril Swelling   Metformin And Related     ROS    See HPI Objective:    Physical Exam Constitutional:      General: She is not in acute distress.    Appearance: Normal appearance. She is well-developed.  HENT:     Head: Normocephalic and atraumatic.     Right Ear: External ear normal.     Left Ear: External ear normal.  Eyes:     General: No scleral icterus. Neck:     Thyroid: No thyromegaly.  Cardiovascular:     Rate and Rhythm: Normal rate and regular rhythm.     Heart sounds: Normal heart sounds. No murmur heard. Pulmonary:     Effort: Pulmonary effort is normal. No respiratory distress.     Breath sounds: Normal breath sounds. No wheezing.  Musculoskeletal:     Cervical back: Neck supple.  Skin:     General: Skin is warm and dry.  Neurological:     Mental Status: She is alert and oriented to person, place, and time.  Psychiatric:        Mood and Affect: Mood normal.  Behavior: Behavior normal.        Thought Content: Thought content normal.        Judgment: Judgment normal.      BP (!) 165/85   Pulse 72   Temp 98.8 F (37.1 C) (Oral)   Resp 16   Ht 5\' 4"  (1.626 m)   Wt 162 lb (73.5 kg)   SpO2 100%   BMI 27.81 kg/m  Wt Readings from Last 3 Encounters:  03/29/23 162 lb (73.5 kg)  12/14/22 158 lb (71.7 kg)  06/15/22 160 lb (72.6 kg)       Assessment & Plan:   Problem List Items Addressed This Visit       Unprioritized   Type 2 diabetes mellitus without complication, without long-term current use of insulin (HCC) - Primary    Lab Results  Component Value Date   HGBA1C 6.6 (H) 12/14/2022   HGBA1C 6.9 (H) 03/30/2022   HGBA1C 7.0 (H) 09/01/2021   Lab Results  Component Value Date   MICROALBUR 2.6 (H) 12/14/2022   LDLCALC 132 (H) 12/14/2022   CREATININE 0.76 12/14/2022   Diet controlled.  Update A1C.       Relevant Orders   HgB A1c   Basic Metabolic Panel (BMET)   Migraines    Notes improvement in her migraines since she got her nexplanon. Keeps imitrex on hand for prn use.      Relevant Medications   carvedilol (COREG) 25 MG tablet   Insomnia    Continues ambien prn.  Wakes up around 3:30-4 AM which she attributes to stress.       Relevant Medications   zolpidem (AMBIEN CR) 12.5 MG CR tablet   Hyperlipidemia    Lab Results  Component Value Date   CHOL 196 12/14/2022   HDL 36.80 (L) 12/14/2022   LDLCALC 132 (H) 12/14/2022   TRIG 136.0 12/14/2022   CHOLHDL 5 12/14/2022   Will plan to check lipid panel next visit. Continue lipitor and dietary modification.      Relevant Medications   carvedilol (COREG) 25 MG tablet   History of kidney stones    Requesting urinalysis.       Relevant Orders   Urinalysis, Routine w reflex microscopic    Hair loss    Dermatology is requesting iron studies. Pt thinks stress is playing a role.      Relevant Orders   CBC w/Diff   IBC + Ferritin   Essential hypertension    Uncontrolled. Will d/c toprol xl 100mg  and change to carvedilol 25mg  bid.       Relevant Medications   carvedilol (COREG) 25 MG tablet   Anxiety and depression    Reports mood is stable when she takes lexapro- sometimes forgets to take it.       Other Visit Diagnoses     Screening for colon cancer       Relevant Orders   Ambulatory referral to Gastroenterology       I have discontinued Lindsey Burton's metoprolol succinate. I am also having her start on carvedilol. Additionally, I am having her maintain her Triamcinolone Acetonide (KENALOG IJ), multivitamin with minerals, Arazlo, Nexplanon, Contour Next Test, Microlet Lancets, fluticasone, SUMAtriptan, blood glucose meter kit and supplies, betamethasone dipropionate, amLODipine, escitalopram, hydrochlorothiazide, atorvastatin, and zolpidem.  Meds ordered this encounter  Medications   zolpidem (AMBIEN CR) 12.5 MG CR tablet    Sig: Take 1 tablet (12.5 mg total) by mouth at bedtime as needed.    Dispense:  30 tablet    Refill:  0    Order Specific Question:   Supervising Provider    Answer:   Danise Edge A [[id number]]   carvedilol (COREG) 25 MG tablet    Sig: Take 1 tablet (25 mg total) by mouth 2 (two) times daily with a meal.    Dispense:  60 tablet    Refill:  3    Order Specific Question:   Supervising Provider    Answer:   Danise Edge A [[id number]]

## 2023-03-29 NOTE — Assessment & Plan Note (Addendum)
Continues ambien prn.  Wakes up around 3:30-4 AM which she attributes to stress.

## 2023-03-29 NOTE — Assessment & Plan Note (Addendum)
Lab Results  Component Value Date   CHOL 196 12/14/2022   HDL 36.80 (L) 12/14/2022   LDLCALC 132 (H) 12/14/2022   TRIG 136.0 12/14/2022   CHOLHDL 5 12/14/2022   Will plan to check lipid panel next visit. Continue lipitor and dietary modification.

## 2023-03-29 NOTE — Assessment & Plan Note (Addendum)
Reports mood is stable when she takes lexapro- sometimes forgets to take it.

## 2023-03-29 NOTE — Assessment & Plan Note (Addendum)
Notes improvement in her migraines since she got her nexplanon. Keeps imitrex on hand for prn use.

## 2023-03-29 NOTE — Assessment & Plan Note (Signed)
Requesting urinalysis.

## 2023-03-29 NOTE — Patient Instructions (Signed)
VISIT SUMMARY:  During your visit today, we discussed your diabetes management, migraines, sleep issues, and stress levels. You shared that you have faced some challenges with your diet recently but have a plan to improve it. We also reviewed your current contraceptive method and its positive impact on your migraines.  YOUR PLAN:  -PREDIABETES: Prediabetes means your blood sugar levels are higher than normal but not high enough to be classified as diabetes. Your A1c has improved from 6.9 to 6.6, which is a positive sign. We will check your A1c again today and encourage you to stick to your plan of reducing sugar intake starting in the new year.  -MENSTRUAL MIGRAINES: Menstrual migraines are headaches that occur around your menstrual cycle. You have noted significant improvement since using the Nexplanon implant. Continue using Imitrex as needed for any migraines.  -CONTRACEPTION: You are currently using the Nexplanon implant for birth control, which has also helped with your migraines. You have expressed satisfaction with this method.  -INSOMNIA: Insomnia is difficulty falling or staying asleep. You have been experiencing early morning awakenings and are using Ambien as needed. We will refill your Ambien prescription and encourage you to practice good sleep habits, such as avoiding screens before bed.  -STRESS MANAGEMENT: You are experiencing high levels of stress, particularly related to family obligations. You plan to set boundaries and take more vacation time to manage this stress. We encourage you to continue these efforts to maintain a healthy work-life balance.  INSTRUCTIONS:  Please follow up with the lab to get your A1c test done today. We will plan for your Nexplanon replacement within the next year. Continue with your dietary changes, stress management strategies, and good sleep hygiene practices. If you have any concerns or experience any new symptoms, please schedule a follow-up  appointment.

## 2023-03-29 NOTE — Assessment & Plan Note (Signed)
Uncontrolled. Will d/c toprol xl 100mg  and change to carvedilol 25mg  bid.

## 2023-03-29 NOTE — Assessment & Plan Note (Signed)
Lab Results  Component Value Date   HGBA1C 6.6 (H) 12/14/2022   HGBA1C 6.9 (H) 03/30/2022   HGBA1C 7.0 (H) 09/01/2021   Lab Results  Component Value Date   MICROALBUR 2.6 (H) 12/14/2022   LDLCALC 132 (H) 12/14/2022   CREATININE 0.76 12/14/2022   Diet controlled.  Update A1C.

## 2023-03-30 LAB — URINALYSIS, ROUTINE W REFLEX MICROSCOPIC
Bilirubin Urine: NEGATIVE
Ketones, ur: NEGATIVE
Leukocytes,Ua: NEGATIVE
Nitrite: NEGATIVE
Specific Gravity, Urine: 1.025 (ref 1.000–1.030)
Total Protein, Urine: NEGATIVE
Urine Glucose: NEGATIVE
Urobilinogen, UA: 0.2 (ref 0.0–1.0)
pH: 6 (ref 5.0–8.0)

## 2023-03-30 LAB — BASIC METABOLIC PANEL
BUN: 12 mg/dL (ref 6–23)
CO2: 28 meq/L (ref 19–32)
Calcium: 9.3 mg/dL (ref 8.4–10.5)
Chloride: 104 meq/L (ref 96–112)
Creatinine, Ser: 0.82 mg/dL (ref 0.40–1.20)
GFR: 84 mL/min (ref >60.00)
Glucose, Bld: 114 mg/dL — ABNORMAL HIGH (ref 70–99)
Potassium: 4 meq/L (ref 3.5–5.1)
Sodium: 140 meq/L (ref 135–145)

## 2023-03-30 LAB — IBC + FERRITIN
Ferritin: 84.3 ng/mL (ref 10.0–291.0)
Iron: 101 ug/dL (ref 42–145)
Saturation Ratios: 32.1 % (ref 20.0–50.0)
TIBC: 315 ug/dL (ref 250.0–450.0)
Transferrin: 225 mg/dL (ref 212.0–360.0)

## 2023-03-30 LAB — CBC WITH DIFFERENTIAL/PLATELET
Basophils Absolute: 0.1 10*3/uL (ref 0.0–0.1)
Basophils Relative: 0.9 % (ref 0.0–3.0)
Eosinophils Absolute: 0.2 10*3/uL (ref 0.0–0.7)
Eosinophils Relative: 1.9 % (ref 0.0–5.0)
HCT: 39.3 % (ref 36.0–46.0)
Hemoglobin: 13 g/dL (ref 12.0–15.0)
Lymphocytes Relative: 29 % (ref 12.0–46.0)
Lymphs Abs: 2.3 10*3/uL (ref 0.7–4.0)
MCHC: 33.1 g/dL (ref 30.0–36.0)
MCV: 90.2 fL (ref 78.0–100.0)
Monocytes Absolute: 0.4 10*3/uL (ref 0.1–1.0)
Monocytes Relative: 4.6 % (ref 3.0–12.0)
Neutro Abs: 5.1 10*3/uL (ref 1.4–7.7)
Neutrophils Relative %: 63.6 % (ref 43.0–77.0)
Platelets: 210 10*3/uL (ref 150.0–400.0)
RBC: 4.36 Mil/uL (ref 3.87–5.11)
RDW: 13.2 % (ref 11.5–15.5)
WBC: 8.1 10*3/uL (ref 4.0–10.5)

## 2023-03-30 LAB — HEMOGLOBIN A1C: Hgb A1c MFr Bld: 7.2 % — ABNORMAL HIGH (ref 4.6–6.5)

## 2023-04-02 ENCOUNTER — Telehealth: Payer: Self-pay | Admitting: Family

## 2023-04-02 MED ORDER — PIOGLITAZONE HCL 30 MG PO TABS
30.0000 mg | ORAL_TABLET | Freq: Every day | ORAL | 1 refills | Status: DC
  Filled 2023-04-02: qty 90, 90d supply, fill #0
  Filled 2023-07-04: qty 90, 90d supply, fill #1

## 2023-04-02 NOTE — Telephone Encounter (Signed)
See mychart.  

## 2023-04-03 ENCOUNTER — Other Ambulatory Visit (HOSPITAL_BASED_OUTPATIENT_CLINIC_OR_DEPARTMENT_OTHER): Payer: Self-pay

## 2023-04-10 ENCOUNTER — Ambulatory Visit: Payer: Commercial Managed Care - HMO | Admitting: Behavioral Health

## 2023-04-10 ENCOUNTER — Encounter: Payer: Self-pay | Admitting: Behavioral Health

## 2023-04-10 DIAGNOSIS — F411 Generalized anxiety disorder: Secondary | ICD-10-CM

## 2023-04-10 DIAGNOSIS — F331 Major depressive disorder, recurrent, moderate: Secondary | ICD-10-CM

## 2023-04-10 NOTE — Progress Notes (Signed)
[facility] Counselor/Therapist Progress Note  Patient ID: Lindsey Burton, MRN: [id number],    Date: 04/10/2023  Time Spent: 56 minutes, 4:00 until 4:56 PM in person with the patient.  Treatment Type: Individual Therapy  Reported Symptoms: Anxiety, depression  Mental Status Exam: Appearance:  Well Groomed     Behavior: Appropriate  Motor: Normal  Speech/Language:  Clear and Coherent  Affect: Appropriate  Mood: normal  Thought process: normal  Thought content:   WNL  Sensory/Perceptual disturbances:   WNL  Orientation: oriented to person, place, time/date, situation, day of week, month of year, and year  Attention: Good  Concentration: Good  Memory: WNL  Fund of knowledge:  Good  Insight:   Good  Judgment:  Good  Impulse Control: Good   Risk Assessment: Danger to Self:  No Self-injurious Behavior: No Danger to Others: No Duty to Warn:no Physical Aggression / Violence:No  Access to Firearms a concern: No  Gang Involvement:No   Subjective: The patient was celebrating Christmas at her brother's house Christmas Eve with extended family.  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  She still struggles some with setting physical boundaries with her mother because her mother always wants her to help her with something so she is trying to figure out the best way to do that but otherwise is doing good with phone boundaries.  She is already blocked off several long weekend to take vacation next year because she did not do that this year in fact had a rollover point on her times year plan.  She and her daughter plan at least some long weekend trips.  Work is still going very well.  She still looking for some  social opportunities but is being very careful about how she does that also. The patient does contract for safety having no thoughts of hurting herself or anyone else.  Interventions: Cognitive Behavioral Therapy and Dialectical Behavioral Therapy  Diagnosis: Generalized anxiety disorder, major depressive disorder, recurrent, mild to moderate  Plan: I will meet with the patient every 2 to 3 weeks in person Target date: January 16, 2023.  I did discuss the progress that the patient is making things going very well.  She is setting clear boundaries.  She is using anxiety reduction techniques.  She is practicing good self-care.  She would like to continue goals as stated with a new target date of July 17, 2023.  Progress: 40%.   [name], [facility]                 [name], [facility]               [name], [facility]        [name], [facility]      [name], [facility]               [name], [facility]               [name], [facility]               [name], [facility]               [name], [facility]  [name], [facility]               [name], [facility]

## 2023-05-03 ENCOUNTER — Ambulatory Visit: Payer: Commercial Managed Care - HMO | Admitting: Family

## 2023-05-04 ENCOUNTER — Encounter: Payer: Self-pay | Admitting: Behavioral Health

## 2023-05-04 ENCOUNTER — Ambulatory Visit: Payer: Commercial Managed Care - HMO | Admitting: Behavioral Health

## 2023-05-04 DIAGNOSIS — F330 Major depressive disorder, recurrent, mild: Secondary | ICD-10-CM | POA: Diagnosis not present

## 2023-05-04 DIAGNOSIS — F411 Generalized anxiety disorder: Secondary | ICD-10-CM | POA: Diagnosis not present

## 2023-05-04 NOTE — Progress Notes (Addendum)
[facility] Counselor/Therapist Progress Note  Patient ID: Lindsey Burton, MRN: [id number],    Date: 05/04/2023  Time Spent: 58 minutes, 3 PM until 3:58 PMThis session was held via video teletherapy. The patient consented to the video teletherapy and was located in her office during this session. She is aware it is the responsibility of the patient to secure confidentiality on her end of the session. The provider was in a private home office for the duration of this session.    The patient arrived on time for her Caregility session   Treatment Type: Individual Therapy  Reported Symptoms: Anxiety, depression  Mental Status Exam: Appearance:  Well Groomed     Behavior: Appropriate  Motor: Normal  Speech/Language:  Clear and Coherent  Affect: Appropriate  Mood: normal  Thought process: normal  Thought content:   WNL  Sensory/Perceptual disturbances:   WNL  Orientation: oriented to person, place, time/date, situation, day of week, month of year, and year  Attention: Good  Concentration: Good  Memory: WNL  Fund of knowledge:  Good  Insight:   Good  Judgment:  Good  Impulse Control: Good   Risk Assessment: Danger to Self:  No Self-injurious Behavior: No Danger to Others: No Duty to Warn:no Physical Aggression / Violence:No  Access to Firearms a concern: No  Gang Involvement:No   Subjective: The patient said the holidays were hectic but she and her brother started to the promise that they will not continue to housed moving forward as they do most of the work and he makes holidays exhausting.  Encouraged her to continue to set boundaries.  She has found a certain way to talk to her mom in terms of setting a boundary with telephone conversations not only how often and how long she talks to her mom.  There are some other family situations in which in the past she would have as she described get in the middle of it but is working hard not to do that anymore knowing  she cannot fix them.  We also looked at how she sees relationships and what she is willing to accept and not accept even in friendships.  She has stuck to her pledge of planning on traveling more this year and has taken some time off the next few months and has a good friend he is willing to do that with her which she is looking forward to. The patient does contract for safety having no thoughts of hurting herself or anyone else.  Interventions: Cognitive Behavioral Therapy and Dialectical Behavioral Therapy  Diagnosis: Generalized anxiety disorder, major depressive disorder, recurrent, mild   Plan: I will meet with the patient every 2 to 3 weeks in person Target date: January 16, 2023.  I did discuss the progress that the patient is making things going very well.  She is setting clear boundaries.  She is using anxiety reduction techniques.  She is practicing good self-care.  She would like to continue goals as stated with a new target date of July 17, 2023.  Progress: 40%.   [name], [facility]                 [name], [facility]               [name], [facility]        [name], [facility]      [name], [facility]  [name], [facility]               [name], [facility]               [name], [facility]               [name], [facility]               [name], [facility]               [name], [facility]               [name], [facility]

## 2023-05-17 ENCOUNTER — Ambulatory Visit: Payer: Commercial Managed Care - HMO | Admitting: Family

## 2023-05-17 VITALS — BP 128/90 | HR 73 | Temp 98.7°F | Resp 16 | Ht 64.0 in | Wt 163.0 lb

## 2023-05-17 DIAGNOSIS — I10 Essential (primary) hypertension: Secondary | ICD-10-CM

## 2023-05-17 NOTE — Patient Instructions (Signed)
VISIT SUMMARY:  You came in today for a follow-up on your hypertension management. Your blood pressure has improved but is still above the target goal. We discussed your current medications and the challenges you face with taking hydrochlorothiazide consistently. We also talked about the impact of recent family events on your diet and exercise routine.  YOUR PLAN:  -HYPERTENSION: Hypertension, or high blood pressure, means that the force of the blood against your artery walls is too high, which can lead to health problems. Your blood pressure has improved but is still above the target goal. You are currently taking amlodipine, carvedilol, and hydrochlorothiazide. It is important to take hydrochlorothiazide consistently, even though it may cause frequent urination. We will check your blood pressure again in one month to see how you are responding to the consistent use of your medication.  -LIFESTYLE MODIFICATION: Maintaining a healthy lifestyle through diet and exercise is crucial for managing hypertension. You have made positive changes in the past, but recent family stressors have disrupted your routine. Please try to resume your healthy diet and exercise regimen once your current family stressors have resolved.  INSTRUCTIONS:  Please take your hydrochlorothiazide consistently as prescribed. We will check your blood pressure again in one month to assess your response to the consistent use of your medication. Additionally, try to resume your healthy diet and exercise routine once your current family stressors have resolved.

## 2023-05-17 NOTE — Progress Notes (Signed)
Subjective:     Patient ID: Lindsey Burton, female    DOB: Jun 19, 1975, 48 y.o.   MRN: [id number]  Chief Complaint  Patient presents with   Hypertension    Here for follow up    HPI  Discussed the use of AI scribe software for clinical note transcription with the patient, who gave verbal consent to proceed.  History of Present Illness   The patient presents for follow-up of hypertension management.  Blood pressure has improved from 165/85 to 143/92, but it remains above the target goal. She wants to achieve better control and is motivated to resume previous healthy habits.  Currently on amlodipine 10 mg, carvedilol, and hydrochlorothiazide for hypertension. She uses hydrochlorothiazide inconsistently due to its diuretic effect interfering with her work schedule. She works from home and has access to a bathroom but finds it challenging to take breaks during work calls.  A recent family situation disrupted her routine, including exercise and diet. Before the disruption, she engaged in regular exercise and dietary changes with her daughter, such as cutting out sugar and adding cardio and weight exercises.  She mentions a recent family bereavement, which involved traveling and attending a funeral. This event impacted her dietary habits, as she was not able to maintain her usual healthy eating while away from home.      BP Readings from Last 3 Encounters:  05/17/23 (!) 128/90  03/29/23 (!) 165/85  12/14/22 139/84       Health Maintenance Due  Topic Date Due   Pneumococcal Vaccine 8-19 Years old (1 of 2 - PCV) Never done   OPHTHALMOLOGY EXAM  Never done   Colonoscopy  Never done   COVID-19 Vaccine (3 - 2024-25 season) 12/18/2022    Past Medical History:  Diagnosis Date   Alopecia    Getting kenalog injections prn   Anxiety    Depression    Hypertension    Insomnia    Migraines    Migraines    Vitamin D deficiency     Past Surgical History:  Procedure  Laterality Date   LIPOSUCTION  09/2017    Family History  Problem Relation Age of Onset   Hypertension Father    Diabetes Father    Hyperlipidemia Father    Heart attack Father    Diabetes Sister    Hypertension Sister    Cancer Neg Hx     Social History   Socioeconomic History   Marital status: Divorced    Spouse name: Not on file   Number of children: Not on file   Years of education: Not on file   Highest education level: Not on file  Occupational History   Not on file  Tobacco Use   Smoking status: Never   Smokeless tobacco: Never  Substance and Sexual Activity   Alcohol use: No   Drug use: No   Sexual activity: Yes    Partners: Male    Birth control/protection: Implant  Other Topics Concern   Not on file  Social History Narrative   Daughter, age 66 autistic Does well in school   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   Divorced, lives with daughter   Enjoys TV   Has fish   Social Drivers of Corporate investment banker Strain: Not on file  Food Insecurity: Not on file  Transportation Needs: Not on file  Physical Activity: Not on file  Stress: Not on file  Social Connections: Unknown (08/27/2021)  [facility]   Social Network    Social Network: Not on file  Intimate Partner Violence: Unknown (07/19/2021)   [facility]   HITS    Physically Hurt: Not on file    Insult or Talk Down To: Not on file    Threaten Physical Harm: Not on file    Scream or Curse: Not on file    Outpatient Medications Prior to Visit  Medication Sig Dispense Refill   amLODipine (NORVASC) 10 MG tablet Take 1 tablet (10 mg total) by mouth daily. 90 tablet 1   atorvastatin (LIPITOR) 40 MG tablet Take 1 tablet (40 mg total) by mouth daily. 90 tablet 1   betamethasone dipropionate 0.05 % cream Apply topically 2 (two) times daily. 30 g 0   blood glucose meter kit and supplies KIT Use up to four  times daily as directed to check blood sugar 1 each 0   carvedilol (COREG) 25 MG tablet Take 1 tablet (25 mg total) by mouth 2 (two) times daily with a meal. 60 tablet 3   escitalopram (LEXAPRO) 20 MG tablet Take 1 tablet (20 mg total) by mouth daily. 90 tablet 1   fluticasone (FLONASE) 50 MCG/ACT nasal spray PLACE 2 SPRAYS INTO BOTH NOSTRILS DAILY 16 g 5   glucose blood (CONTOUR NEXT TEST) test strip Use up to 4 times a day as directed to check blood sugar 100 each 12   hydrochlorothiazide (HYDRODIURIL) 25 MG tablet Take 1 tablet (25 mg total) by mouth daily. 90 tablet 1   Microlet Lancets MISC Use up to 4 times a day as directed to check blood sugar 100 each 11   Multiple Vitamins-Minerals (MULTIVITAMIN WITH MINERALS) tablet Take 1 tablet by mouth daily. 30 tablet    pioglitazone (ACTOS) 30 MG tablet Take 1 tablet (30 mg total) by mouth daily. 90 tablet 1   SUMAtriptan (IMITREX) 100 MG tablet TAKE ONE TABLET BY MOUTH AT START OF HEADACHE, MAY REPEAT 2 HOURS LATER AS NEEDED. MAX 2 DOSE PER 24 HOURS 10 tablet 5   Tazarotene (ARAZLO) 0.045 % LOTN Apply topically.     Triamcinolone Acetonide (KENALOG IJ) Inject 60 mg as directed. As needed or Every 1 1/2 months for Alopecia     zolpidem (AMBIEN CR) 12.5 MG CR tablet Take 1 tablet (12.5 mg total) by mouth at bedtime as needed. 30 tablet 0   etonogestrel (NEXPLANON) 68 MG IMPL implant 1 each (68 mg total) by Subdermal route once for 1 dose. 1 each 0   No facility-administered medications prior to visit.    Allergies  Allergen Reactions   Ace Inhibitors Swelling    angioedema    Lisinopril Swelling   Metformin And Related     ROS See HPI    Objective:    Physical Exam Constitutional:      General: She is not in acute distress.    Appearance: Normal appearance. She is well-developed.  HENT:     Head: Normocephalic and atraumatic.     Right Ear: External ear normal.     Left Ear: External ear normal.  Eyes:     General: No scleral  icterus. Neck:     Thyroid: No thyromegaly.  Cardiovascular:     Rate and Rhythm: Normal rate and regular rhythm.     Heart sounds: Normal heart sounds. No murmur heard. Pulmonary:     Effort: Pulmonary effort is normal. No respiratory distress.  Breath sounds: Normal breath sounds. No wheezing.  Musculoskeletal:     Cervical back: Neck supple.  Skin:    General: Skin is warm and dry.  Neurological:     Mental Status: She is alert and oriented to person, place, and time.  Psychiatric:        Mood and Affect: Mood normal.        Behavior: Behavior normal.        Thought Content: Thought content normal.        Judgment: Judgment normal.      BP (!) 128/90   Pulse 73   Temp 98.7 F (37.1 C) (Lindsey)   Resp 16   Ht 5\' 4"  (1.626 m)   Wt 163 lb (73.9 kg)   SpO2 100%   BMI 27.98 kg/m  Wt Readings from Last 3 Encounters:  05/17/23 163 lb (73.9 kg)  03/29/23 162 lb (73.5 kg)  12/14/22 158 lb (71.7 kg)       Assessment & Plan:   Problem List Items Addressed This Visit       Unprioritized   Essential hypertension - Primary   Second BP check is better 128/90.  Patient is on amlodipine 10mg , carvedilol, and hydrochlorothiazide, but admits to inconsistent use of hydrochlorothiazide due to concerns about frequent urination. -Encourage consistent use of hydrochlorothiazide. -Check blood pressure in 1 month to assess response to consistent medication use.   Lifestyle Modification Patient reports recent improvements in diet and exercise, but recent family stressors have disrupted these positive changes. -Encourage patient to resume healthy diet and exercise regimen after resolution of current family stressors.       I am having Noralyn Pick maintain her Triamcinolone Acetonide (KENALOG IJ), multivitamin with minerals, Arazlo, Nexplanon, Contour Next Test, Microlet Lancets, fluticasone, SUMAtriptan, blood glucose meter kit and supplies, betamethasone dipropionate,  amLODipine, escitalopram, hydrochlorothiazide, atorvastatin, zolpidem, carvedilol, and pioglitazone.  No orders of the defined types were placed in this encounter.

## 2023-05-17 NOTE — Assessment & Plan Note (Addendum)
Second BP check is better 128/90.  Patient is on amlodipine 10mg , carvedilol, and hydrochlorothiazide, but admits to inconsistent use of hydrochlorothiazide due to concerns about frequent urination. -Encourage consistent use of hydrochlorothiazide. -Check blood pressure in 1 month to assess response to consistent medication use.   Lifestyle Modification Patient reports recent improvements in diet and exercise, but recent family stressors have disrupted these positive changes. -Encourage patient to resume healthy diet and exercise regimen after resolution of current family stressors.

## 2023-05-23 ENCOUNTER — Encounter: Payer: Self-pay | Admitting: Behavioral Health

## 2023-05-23 ENCOUNTER — Ambulatory Visit: Payer: Commercial Managed Care - HMO | Admitting: Behavioral Health

## 2023-05-23 DIAGNOSIS — F411 Generalized anxiety disorder: Secondary | ICD-10-CM

## 2023-05-23 DIAGNOSIS — F330 Major depressive disorder, recurrent, mild: Secondary | ICD-10-CM | POA: Diagnosis not present

## 2023-05-23 NOTE — Progress Notes (Addendum)
 [facility] Counselor/Therapist Progress Note  Patient ID: Lindsey Burton, MRN: [id number],    Date: 05/23/2023  Time Spent: 58 minutes, 4 PM until 4:58 PM.  This session was conducted in the office in the outpatient therapist office.    Treatment Type: Individual Therapy  Reported Symptoms: Anxiety, depression  Mental Status Exam: Appearance:  Well Groomed     Behavior: Appropriate  Motor: Normal  Speech/Language:  Clear and Coherent  Affect: Appropriate  Mood: normal  Thought process: normal  Thought content:   WNL  Sensory/Perceptual disturbances:   WNL  Orientation: oriented to person, place, time/date, situation, day of week, month of year, and year  Attention: Good  Concentration: Good  Memory: WNL  Fund of knowledge:  Good  Insight:   Good  Judgment:  Good  Impulse Control: Good   Risk Assessment: Danger to Self:  No Self-injurious Behavior: No Danger to Others: No Duty to Warn:no Physical Aggression / Violence:No  Access to Firearms a concern: No  Gang Involvement:No   Subjective: The patient has seen the benefits of setting boundaries especially in phone conversations with her family members, especially her mother.  Other boundaries are proving harder to set especially with her mother.  She talked about a weekend trip that they had to take for her hot's funeral in which her mother kept finding ways to involve the patient including how to use the remote, how to connect to the Internet, not sitting down immediately with family members until the patient came inside.  I asked what would happen if she set some very clear boundaries there she said that her mother would not speak to her for a short amount of time would not return her phone calls etc.  She says she is not up with other people as well as the patient.  We talked about wearing the balance of gradual exposure to setting physical boundaries in addition to the good phone boundaries she has  already set and knowing it would be a learning curve with hopes of the mother recognizing that she can count on the patient without being dependent on the patient.  The patient does contract for safety having no thoughts of hurting herself or anyone else.  Interventions: Cognitive Behavioral Therapy and Dialectical Behavioral Therapy  Diagnosis: Generalized anxiety disorder, major depressive disorder, recurrent, mild   Plan: I will meet with the patient every 2 to 3 weeks in person Target date: January 16, 2023.  I did discuss the progress that the patient is making things going very well.  She is setting clear boundaries.  She is using anxiety reduction techniques.  She is practicing good self-care.  She would like to continue goals as stated with a new target date of July 17, 2023.  Progress: 40%.   July 25, 2023.  The patient was scheduled for appointments on February 18 and June 19, 2023 but canceled both and did not schedule any future appointments.  Lorrene CHRISTELLA Hasten, [facility]                 Lorrene CHRISTELLA Hasten, [facility]               Lorrene CHRISTELLA Hasten, [facility]        Lorrene CHRISTELLA Hasten, [facility]      Lorrene CHRISTELLA Hasten, [facility]               Lorrene CHRISTELLA Hasten, [facility]  Lorrene CHRISTELLA Hasten, [facility]               Lorrene CHRISTELLA Hasten, [facility]               Lorrene CHRISTELLA Hasten, [facility]               Lorrene CHRISTELLA Hasten, [facility]               Lorrene CHRISTELLA Hasten, [facility]               Lorrene CHRISTELLA Hasten, [facility]               Lorrene CHRISTELLA Hasten, [facility]

## 2023-06-06 ENCOUNTER — Ambulatory Visit: Payer: Commercial Managed Care - HMO | Admitting: Behavioral Health

## 2023-06-14 ENCOUNTER — Ambulatory Visit: Payer: Commercial Managed Care - HMO | Admitting: Family

## 2023-06-19 ENCOUNTER — Ambulatory Visit: Payer: Commercial Managed Care - HMO | Admitting: Behavioral Health

## 2023-07-05 ENCOUNTER — Ambulatory Visit: Payer: Commercial Managed Care - HMO | Admitting: Family

## 2023-07-05 ENCOUNTER — Telehealth: Payer: Self-pay | Admitting: Family

## 2023-07-05 ENCOUNTER — Encounter: Payer: Self-pay | Admitting: Family

## 2023-07-05 ENCOUNTER — Other Ambulatory Visit (HOSPITAL_BASED_OUTPATIENT_CLINIC_OR_DEPARTMENT_OTHER): Payer: Self-pay

## 2023-07-05 VITALS — BP 148/88 | HR 77 | Temp 98.8°F | Resp 16 | Ht 64.0 in | Wt 162.0 lb

## 2023-07-05 DIAGNOSIS — E785 Hyperlipidemia, unspecified: Secondary | ICD-10-CM | POA: Diagnosis not present

## 2023-07-05 DIAGNOSIS — J302 Other seasonal allergic rhinitis: Secondary | ICD-10-CM

## 2023-07-05 DIAGNOSIS — G43909 Migraine, unspecified, not intractable, without status migrainosus: Secondary | ICD-10-CM

## 2023-07-05 DIAGNOSIS — E119 Type 2 diabetes mellitus without complications: Secondary | ICD-10-CM

## 2023-07-05 DIAGNOSIS — Z8639 Personal history of other endocrine, nutritional and metabolic disease: Secondary | ICD-10-CM

## 2023-07-05 DIAGNOSIS — G4700 Insomnia, unspecified: Secondary | ICD-10-CM

## 2023-07-05 DIAGNOSIS — F32A Depression, unspecified: Secondary | ICD-10-CM

## 2023-07-05 DIAGNOSIS — I10 Essential (primary) hypertension: Secondary | ICD-10-CM

## 2023-07-05 DIAGNOSIS — F419 Anxiety disorder, unspecified: Secondary | ICD-10-CM

## 2023-07-05 LAB — COMPREHENSIVE METABOLIC PANEL
ALT: 13 U/L (ref 0–35)
AST: 19 U/L (ref 0–37)
Albumin: 4.3 g/dL (ref 3.5–5.2)
Alkaline Phosphatase: 112 U/L (ref 39–117)
BUN: 12 mg/dL (ref 6–23)
CO2: 29 meq/L (ref 19–32)
Calcium: 9 mg/dL (ref 8.4–10.5)
Chloride: 104 meq/L (ref 96–112)
Creatinine, Ser: 0.69 mg/dL (ref 0.40–1.20)
GFR: 101.72 mL/min (ref >60.00)
Glucose, Bld: 128 mg/dL — ABNORMAL HIGH (ref 70–99)
Potassium: 4.1 meq/L (ref 3.5–5.1)
Sodium: 139 meq/L (ref 135–145)
Total Bilirubin: 0.8 mg/dL (ref 0.2–1.2)
Total Protein: 7.1 g/dL (ref 6.0–8.3)

## 2023-07-05 LAB — HEMOGLOBIN A1C: Hgb A1c MFr Bld: 7.4 % — ABNORMAL HIGH (ref 4.6–6.5)

## 2023-07-05 LAB — LIPID PANEL
Cholesterol: 217 mg/dL — ABNORMAL HIGH (ref 0–200)
HDL: 47.9 mg/dL (ref >39.00)
LDL Cholesterol: 144 mg/dL — ABNORMAL HIGH (ref 0–99)
NonHDL: 168.81
Total CHOL/HDL Ratio: 5
Triglycerides: 124 mg/dL (ref 0.0–149.0)
VLDL: 24.8 mg/dL (ref 0.0–40.0)

## 2023-07-05 MED ORDER — SITAGLIPTIN PHOSPHATE 100 MG PO TABS
100.0000 mg | ORAL_TABLET | Freq: Every day | ORAL | 5 refills | Status: DC
  Filled 2023-07-05: qty 30, 30d supply, fill #0

## 2023-07-05 MED ORDER — ATORVASTATIN CALCIUM 80 MG PO TABS
80.0000 mg | ORAL_TABLET | Freq: Every day | ORAL | 1 refills | Status: AC
  Filled 2023-07-05: qty 90, 90d supply, fill #0

## 2023-07-05 MED ORDER — HYDRALAZINE HCL 10 MG PO TABS
10.0000 mg | ORAL_TABLET | Freq: Three times a day (TID) | ORAL | 2 refills | Status: AC
  Filled 2023-07-05: qty 90, 30d supply, fill #0

## 2023-07-05 MED ORDER — ZOLPIDEM TARTRATE ER 12.5 MG PO TBCR
12.5000 mg | EXTENDED_RELEASE_TABLET | Freq: Every evening | ORAL | 2 refills | Status: DC | PRN
  Filled 2023-07-05: qty 30, 30d supply, fill #0
  Filled 2023-11-14: qty 30, 30d supply, fill #1

## 2023-07-05 MED ORDER — ZOLPIDEM TARTRATE ER 12.5 MG PO TBCR
12.5000 mg | EXTENDED_RELEASE_TABLET | Freq: Every evening | ORAL | 0 refills | Status: DC | PRN
  Filled 2023-07-05: qty 2, 2d supply, fill #0

## 2023-07-05 MED ORDER — LORATADINE 10 MG PO TABS: 10.0000 mg | ORAL_TABLET | Freq: Every day | ORAL | Status: AC

## 2023-07-05 NOTE — Progress Notes (Signed)
 Subjective:     Patient ID: Lindsey Burton, female    DOB: January 06, 1976, 48 y.o.   MRN: [id number]  Chief Complaint  Patient presents with   Hypertension    Here for follow up    STD screening    Will like to be tested for STDs, no symptoms    Hypertension    Discussed the use of AI scribe software for clinical note transcription with the patient, who gave verbal consent to proceed.  History of Present Illness  Lindsey Burton is a 48 year old female with hypertension and type 2 diabetes who presents for follow-up on blood pressure and blood sugar control.  She is currently taking amlodipine 10 mg and carvedilol 25 mg for hypertension. Despite medication, she finds it challenging to maintain a healthy diet, often opting for unhealthy food choices due to convenience and cost. Her brother occasionally assists her in making healthier choices when dining out, but she struggles when alone. She is more active, engaging in cardio and weight exercises three to five times a week.  Her last A1c was 7.2, up from 6.6, and she is concerned about maintaining it under 7. She has previously tried metformin but experienced severe diarrhea and is not currently on any diabetes medication other than lifestyle modifications.  She experiences difficulty with sleep, especially when she runs out of Ambien, which she takes for insomnia. Without it, she stays up until the early morning hours. She is currently out of Ambien and needs a refill.  She takes 25 mg diphenhydramine for allergies but is advised to consider a non-drowsy alternative. No current allergy symptoms are reported.  Her family history includes bulging eyes on her father's side, but she has not had any thyroid procedures and her thyroid tests have been normal. She is concerned about her daughter's mental health, as her therapist has noted a deepening depressive state.     Health Maintenance Due  Topic Date Due   Pneumococcal Vaccine  61-62 Years old (1 of 2 - PCV) Never done   OPHTHALMOLOGY EXAM  Never done   Colonoscopy  Never done   COVID-19 Vaccine (3 - 2024-25 season) 12/18/2022    Past Medical History:  Diagnosis Date   Alopecia    Getting kenalog injections prn   Anxiety    Depression    Hypertension    Insomnia    Migraines    Migraines    Vitamin D deficiency     Past Surgical History:  Procedure Laterality Date   LIPOSUCTION  09/2017    Family History  Problem Relation Age of Onset   Hypertension Father    Diabetes Father    Hyperlipidemia Father    Heart attack Father    Diabetes Sister    Hypertension Sister    Cancer Neg Hx     Social History   Socioeconomic History   Marital status: Divorced    Spouse name: Not on file   Number of children: Not on file   Years of education: Not on file   Highest education level: Some college, no degree  Occupational History   Not on file  Tobacco Use   Smoking status: Never   Smokeless tobacco: Never  Substance and Sexual Activity   Alcohol use: No   Drug use: No   Sexual activity: Yes    Partners: Male    Birth control/protection: Implant  Other Topics Concern   Not on file  Social History Narrative   Daughter,  age 50 autistic Does well in school   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   Divorced, lives with daughter   Enjoys TV   Has fish   Social Drivers of Health   Financial Resource Strain: Low Risk  (07/04/2023)   Overall Financial Resource Strain (CARDIA)    Difficulty of Paying Living Expenses: Not very hard  Food Insecurity: No Food Insecurity (07/04/2023)   Hunger Vital Sign    Worried About Running Out of Food in the Last Year: Never true    Ran Out of Food in the Last Year: Never true  Transportation Needs: No Transportation Needs (07/04/2023)   PRAPARE - Administrator, Civil Service (Medical): No    Lack of Transportation (Non-Medical): No  Physical Activity: Insufficiently  Active (07/04/2023)   Exercise Vital Sign    Days of Exercise per Week: 3 days    Minutes of Exercise per Session: 40 min  Stress: Stress Concern Present (07/04/2023)   [employer] of Occupational Health - Occupational Stress Questionnaire    Feeling of Stress : To some extent  Social Connections: Socially Isolated (07/04/2023)   Social Connection and Isolation Panel [NHANES]    Frequency of Communication with Friends and Family: More than three times a week    Frequency of Social Gatherings with Friends and Family: Once a week    Attends Religious Services: Never    Database administrator or Organizations: No    Attends Engineer, structural: Not on file    Marital Status: Divorced  Intimate Partner Violence: Unknown (07/19/2021)   Received from [employer], [facility]   HITS    Physically Hurt: Not on file    Insult or Talk Down To: Not on file    Threaten Physical Harm: Not on file    Scream or Curse: Not on file    Outpatient Medications Prior to Visit  Medication Sig Dispense Refill   amLODipine (NORVASC) 10 MG tablet Take 1 tablet (10 mg total) by mouth daily. 90 tablet 1   atorvastatin (LIPITOR) 40 MG tablet Take 1 tablet (40 mg total) by mouth daily. 90 tablet 1   betamethasone dipropionate 0.05 % cream Apply topically 2 (two) times daily. 30 g 0   blood glucose meter kit and supplies KIT Use up to four times daily as directed to check blood sugar 1 each 0   carvedilol (COREG) 25 MG tablet Take 1 tablet (25 mg total) by mouth 2 (two) times daily with a meal. 60 tablet 3   escitalopram (LEXAPRO) 20 MG tablet Take 1 tablet (20 mg total) by mouth daily. 90 tablet 1   fluticasone (FLONASE) 50 MCG/ACT nasal spray PLACE 2 SPRAYS INTO BOTH NOSTRILS DAILY 16 g 5   glucose blood (CONTOUR NEXT TEST) test strip Use up to 4 times a day as directed to check blood sugar 100 each 12   hydrochlorothiazide (HYDRODIURIL) 25 MG tablet Take 1 tablet (25 mg total) by mouth daily.  90 tablet 1   Microlet Lancets MISC Use up to 4 times a day as directed to check blood sugar 100 each 11   Multiple Vitamins-Minerals (MULTIVITAMIN WITH MINERALS) tablet Take 1 tablet by mouth daily. 30 tablet    pioglitazone (ACTOS) 30 MG tablet Take 1 tablet (30 mg total) by mouth daily. 90 tablet 1   SUMAtriptan (IMITREX) 100 MG tablet TAKE ONE TABLET BY MOUTH AT START OF HEADACHE, MAY REPEAT 2  HOURS LATER AS NEEDED. MAX 2 DOSE PER 24 HOURS 10 tablet 5   Tazarotene (ARAZLO) 0.045 % LOTN Apply topically.     Triamcinolone Acetonide (KENALOG IJ) Inject 60 mg as directed. As needed or Every 1 1/2 months for Alopecia     zolpidem (AMBIEN CR) 12.5 MG CR tablet Take 1 tablet (12.5 mg total) by mouth at bedtime as needed. 30 tablet 0   etonogestrel (NEXPLANON) 68 MG IMPL implant 1 each (68 mg total) by Subdermal route once for 1 dose. 1 each 0   No facility-administered medications prior to visit.    Allergies  Allergen Reactions   Ace Inhibitors Swelling    angioedema    Lisinopril Swelling   Metformin And Related     ROS    See HPI Objective:    Physical Exam Constitutional:      General: She is not in acute distress.    Appearance: Normal appearance. She is well-developed.  HENT:     Head: Normocephalic and atraumatic.     Right Ear: External ear normal.     Left Ear: External ear normal.  Eyes:     General: No scleral icterus. Neck:     Thyroid: No thyromegaly.  Cardiovascular:     Rate and Rhythm: Normal rate and regular rhythm.     Heart sounds: Normal heart sounds. No murmur heard. Pulmonary:     Effort: Pulmonary effort is normal. No respiratory distress.     Breath sounds: Normal breath sounds. No wheezing.  Musculoskeletal:     Cervical back: Neck supple.  Skin:    General: Skin is warm and dry.  Neurological:     Mental Status: She is alert and oriented to person, place, and time.  Psychiatric:        Mood and Affect: Mood normal.        Behavior: Behavior  normal.        Thought Content: Thought content normal.        Judgment: Judgment normal.      BP (!) 148/88   Pulse 77   Temp 98.8 F (37.1 C) (Oral)   Resp 16   Ht 5\' 4"  (1.626 m)   Wt 162 lb (73.5 kg)   SpO2 100%   BMI 27.81 kg/m  Wt Readings from Last 3 Encounters:  07/05/23 162 lb (73.5 kg)  05/17/23 163 lb (73.9 kg)  03/29/23 162 lb (73.5 kg)       Assessment & Plan:   Problem List Items Addressed This Visit       Unprioritized   Type 2 diabetes mellitus without complication, without long-term current use of insulin (HCC) - Primary   Lab Results  Component Value Date   HGBA1C 7.2 (H) 03/29/2023   HGBA1C 6.6 (H) 12/14/2022   HGBA1C 6.9 (H) 03/30/2022   Lab Results  Component Value Date   MICROALBUR 2.6 (H) 12/14/2022   LDLCALC 132 (H) 12/14/2022   CREATININE 0.82 03/29/2023   Continues actos, dietary efforts.  Will update A1C.       Relevant Orders   HgB A1c   Comp Met (CMET)   Migraines   No recent migraines. Has Rx for prn use of imitrex.       Relevant Medications   hydrALAZINE (APRESOLINE) 10 MG tablet   Insomnia   She continues to sleep well with Ambien. Contract up to date.       Relevant Medications   zolpidem (AMBIEN CR) 12.5 MG CR  tablet   Hyperlipidemia   Lab Results  Component Value Date   CHOL 196 12/14/2022   HDL 36.80 (L) 12/14/2022   LDLCALC 132 (H) 12/14/2022   TRIG 136.0 12/14/2022   CHOLHDL 5 12/14/2022   Last LDL was elevated last visit- on atorvastatin.  Will update today.      Relevant Medications   hydrALAZINE (APRESOLINE) 10 MG tablet   Other Relevant Orders   Lipid panel   History of hyperthyroidism   Pt states that she was never formally diagnosed as hyperthyroid, but it was suspected due to her bulging eyes. Last TSH WNL.  Lab Results  Component Value Date   TSH 1.28 12/14/2022          Relevant Medications   hydrALAZINE (APRESOLINE) 10 MG tablet   Essential hypertension   BP Readings from Last 3  Encounters:  07/05/23 (!) 148/88  05/17/23 (!) 128/90  03/29/23 (!) 165/85   Above goal despite strict med compliance and multiple antihypertensives.  Will add hydralazine 10mg  TID. Follow back up in 1 month.        Relevant Medications   hydrALAZINE (APRESOLINE) 10 MG tablet   Anxiety and depression   Stable on lexapro 20mg  once daily.       Allergic rhinitis   Has been using benadryl prn. Recommend trial of claritin instead as it is 24 hours and non-drowsy.      Relevant Medications   loratadine (CLARITIN) 10 MG tablet    I am having Noralyn Pick start on hydrALAZINE and loratadine. I am also having her maintain her Triamcinolone Acetonide (KENALOG IJ), multivitamin with minerals, Arazlo, Nexplanon, Contour Next Test, Microlet Lancets, fluticasone, SUMAtriptan, blood glucose meter kit and supplies, betamethasone dipropionate, amLODipine, escitalopram, hydrochlorothiazide, atorvastatin, carvedilol, pioglitazone, and zolpidem.  Meds ordered this encounter  Medications   hydrALAZINE (APRESOLINE) 10 MG tablet    Sig: Take 1 tablet (10 mg total) by mouth 3 (three) times daily.    Dispense:  90 tablet    Refill:  2    Supervising Provider:   Danise Edge A [[id number]]   zolpidem (AMBIEN CR) 12.5 MG CR tablet    Sig: Take 1 tablet (12.5 mg total) by mouth at bedtime as needed.    Dispense:  2 tablet    Refill:  0    Supervising Provider:   Danise Edge A [[id number]]   loratadine (CLARITIN) 10 MG tablet    Sig: Take 1 tablet (10 mg total) by mouth daily.    Supervising Provider:   Danise Edge A [id number]

## 2023-07-05 NOTE — Assessment & Plan Note (Addendum)
 Pt states that she was never formally diagnosed as hyperthyroid, but it was suspected due to her bulging eyes. Last TSH WNL.  Lab Results  Component Value Date   TSH 1.28 12/14/2022

## 2023-07-05 NOTE — Assessment & Plan Note (Signed)
 Lab Results  Component Value Date   CHOL 196 12/14/2022   HDL 36.80 (L) 12/14/2022   LDLCALC 132 (H) 12/14/2022   TRIG 136.0 12/14/2022   CHOLHDL 5 12/14/2022   Last LDL was elevated last visit- on atorvastatin.  Will update today.

## 2023-07-05 NOTE — Assessment & Plan Note (Addendum)
 She continues to sleep well with Ambien. Contract up to date.

## 2023-07-05 NOTE — Assessment & Plan Note (Addendum)
 No recent migraines. Has Rx for prn use of imitrex.

## 2023-07-05 NOTE — Assessment & Plan Note (Addendum)
 BP Readings from Last 3 Encounters:  07/05/23 (!) 148/88  05/17/23 (!) 128/90  03/29/23 (!) 165/85   Above goal despite strict med compliance and multiple antihypertensives.  Will add hydralazine 10mg  TID. Follow back up in 1 month.

## 2023-07-05 NOTE — Assessment & Plan Note (Addendum)
 Has been using benadryl prn. Recommend trial of claritin instead as it is 24 hours and non-drowsy.

## 2023-07-05 NOTE — Assessment & Plan Note (Signed)
Stable on lexapro 20mg once daily.    

## 2023-07-05 NOTE — Telephone Encounter (Signed)
 Cholesterol remains above goal. Please increase atorvastatin to 80mg  once daily.   Sugar is above goal, A1C up to 7.4.    Also, I sent her a mychart message that I resent the ambien rx with the proper instructions.

## 2023-07-05 NOTE — Patient Instructions (Signed)
 VISIT SUMMARY:  Today, we discussed your blood pressure and blood sugar control, as well as your difficulties with sleep and allergies. We also reviewed your general health maintenance, including upcoming appointments.  YOUR PLAN:  -HYPERTENSION: Hypertension, or high blood pressure, means that the force of the blood against your artery walls is too high. We have prescribed hydralazine to be taken three times daily to help better control your blood pressure. Please recheck your blood pressure in one month.  -TYPE 2 DIABETES MELLITUS: Type 2 diabetes is a condition where your body does not use insulin properly, leading to high blood sugar levels. Your hemoglobin A1c has increased to 7.2, so we will repeat the test today and consider alternative medications if it remains over 7. We also discussed making healthier dietary choices.  -INSOMNIA: Insomnia is difficulty falling or staying asleep. Since Ambien has been effective for you, we have sent a prescription for a refill.  -GENERALIZED ANXIETY DISORDER: Generalized anxiety disorder involves persistent and excessive worry. Your anxiety and mood have improved with Lexapro 20 mg, so we will continue this medication.  -ALLERGIC RHINITIS: Allergic rhinitis is an allergic reaction that causes sneezing, congestion, and a runny nose. We recommend switching to Claritin or Zyrtec, which are non-drowsy alternatives to diphenhydramine, for daily allergy management.  -GENERAL HEALTH MAINTENANCE: We reviewed your general health maintenance. Please remember to attend your eye doctor appointment on April 1st and schedule your colonoscopy.  INSTRUCTIONS:  Please recheck your blood pressure in one month and repeat the hemoglobin A1c test today. Attend your eye doctor appointment on April 1st and schedule your colonoscopy.

## 2023-07-05 NOTE — Assessment & Plan Note (Addendum)
 Lab Results  Component Value Date   HGBA1C 7.2 (H) 03/29/2023   HGBA1C 6.6 (H) 12/14/2022   HGBA1C 6.9 (H) 03/30/2022   Lab Results  Component Value Date   MICROALBUR 2.6 (H) 12/14/2022   LDLCALC 132 (H) 12/14/2022   CREATININE 0.82 03/29/2023   Continues actos, dietary efforts.  Will update A1C.

## 2023-07-06 NOTE — Telephone Encounter (Signed)
 Patient notified of results, provider's comments and medication dose increase.

## 2023-07-10 ENCOUNTER — Encounter: Payer: Self-pay | Admitting: Family

## 2023-07-10 ENCOUNTER — Other Ambulatory Visit (HOSPITAL_BASED_OUTPATIENT_CLINIC_OR_DEPARTMENT_OTHER): Payer: Self-pay

## 2023-07-18 LAB — HM DIABETES EYE EXAM

## 2023-07-21 ENCOUNTER — Telehealth: Payer: Self-pay

## 2023-07-21 NOTE — Telephone Encounter (Signed)
 Copied from CRM [id number]. Topic: General - Other >> Jul 21, 2023  9:41 AM Fredrich Romans wrote: Reason for CRM: [place] [facility] would like to know if she could have labs form 07/05/2023,sent over to their office.They are needing to see patient iron and ferritin levels.  Fax number:[phone]

## 2023-07-24 NOTE — Telephone Encounter (Signed)
 Last iron and ferritin done 03/29/23, labs faxed

## 2023-08-09 ENCOUNTER — Ambulatory Visit: Admitting: Family

## 2023-08-23 ENCOUNTER — Encounter (HOSPITAL_COMMUNITY): Payer: Self-pay

## 2023-08-30 ENCOUNTER — Ambulatory Visit: Admitting: Family

## 2023-09-13 ENCOUNTER — Other Ambulatory Visit (HOSPITAL_BASED_OUTPATIENT_CLINIC_OR_DEPARTMENT_OTHER): Payer: Self-pay

## 2023-09-13 ENCOUNTER — Ambulatory Visit: Admitting: Family

## 2023-09-13 VITALS — BP 126/75 | HR 85 | Temp 99.0°F | Resp 16 | Ht 64.0 in | Wt 161.0 lb

## 2023-09-13 DIAGNOSIS — E119 Type 2 diabetes mellitus without complications: Secondary | ICD-10-CM | POA: Diagnosis not present

## 2023-09-13 DIAGNOSIS — Z309 Encounter for contraceptive management, unspecified: Secondary | ICD-10-CM | POA: Diagnosis not present

## 2023-09-13 DIAGNOSIS — I10 Essential (primary) hypertension: Secondary | ICD-10-CM | POA: Diagnosis not present

## 2023-09-13 MED ORDER — HYDROCHLOROTHIAZIDE 25 MG PO TABS
25.0000 mg | ORAL_TABLET | Freq: Every day | ORAL | 1 refills | Status: AC
  Filled 2023-09-13: qty 90, 90d supply, fill #0
  Filled 2024-03-04: qty 90, 90d supply, fill #1

## 2023-09-13 NOTE — Progress Notes (Unsigned)
 Subjective:     Patient ID: Lindsey Burton, female    DOB: 1975-12-23, 48 y.o.   MRN: [id number]  Chief Complaint  Patient presents with   Hypertension    Here for follow up    Hypertension    Discussed the use of AI scribe software for clinical note transcription with the patient, who gave verbal consent to proceed.  History of Present Illness Lindsey Burton is a 48 year old female with diabetes who presents for medication management and lifestyle changes.  She was previously prescribed hydralazine  and a brand name medication, which she could not afford due to insurance issues. She has not purchased the medication and is focusing on lifestyle changes, including stress reduction and dietary improvements, to manage her condition. She is creating a healthier environment and has learned to say 'no' to certain demands, contributing to her overall health improvement. Her daughter will be spending the summer with her father, allowing her to focus more on her health and lifestyle. She plans to use this time to cook healthier meals and focus on her well-being.      Health Maintenance Due  Topic Date Due   OPHTHALMOLOGY EXAM  Never done   Pneumococcal Vaccine 55-34 Years old (1 of 2 - PCV) Never done   Colonoscopy  Never done   COVID-19 Vaccine (3 - 2024-25 season) 12/18/2022    Past Medical History:  Diagnosis Date   Alopecia    Getting kenalog injections prn   Anxiety    Depression    Hypertension    Insomnia    Migraines    Migraines    Vitamin D  deficiency     Past Surgical History:  Procedure Laterality Date   LIPOSUCTION  09/2017    Family History  Problem Relation Age of Onset   Hypertension Father    Diabetes Father    Hyperlipidemia Father    Heart attack Father    Diabetes Sister    Hypertension Sister    Cancer Neg Hx     Social History   Socioeconomic History   Marital status: Divorced    Spouse name: Not on file   Number of children: Not  on file   Years of education: Not on file   Highest education level: Some college, no degree  Occupational History   Not on file  Tobacco Use   Smoking status: Never   Smokeless tobacco: Never  Substance and Sexual Activity   Alcohol use: No   Drug use: No   Sexual activity: Yes    Partners: Male    Birth control/protection: Implant  Other Topics Concern   Not on file  Social History Narrative   Daughter, age 16 autistic Does well in school   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   Divorced, lives with daughter   Enjoys TV   Has fish   Social Drivers of Health   Financial Resource Strain: Low Risk  (07/04/2023)   Overall Financial Resource Strain (CARDIA)    Difficulty of Paying Living Expenses: Not very hard  Food Insecurity: No Food Insecurity (07/04/2023)   Hunger Vital Sign    Worried About Running Out of Food in the Last Year: Never true    Ran Out of Food in the Last Year: Never true  Transportation Needs: No Transportation Needs (07/04/2023)   PRAPARE - Administrator, Civil Service (Medical): No    Lack of Transportation (Non-Medical): No  Physical  Activity: Insufficiently Active (07/04/2023)   Exercise Vital Sign    Days of Exercise per Week: 3 days    Minutes of Exercise per Session: 40 min  Stress: Stress Concern Present (07/04/2023)   [employer] of Occupational Health - Occupational Stress Questionnaire    Feeling of Stress : To some extent  Social Connections: Socially Isolated (07/04/2023)   Social Connection and Isolation Panel [NHANES]    Frequency of Communication with Friends and Family: More than three times a week    Frequency of Social Gatherings with Friends and Family: Once a week    Attends Religious Services: Never    Database administrator or Organizations: No    Attends Engineer, structural: Not on file    Marital Status: Divorced  Intimate Partner Violence: Unknown (07/19/2021)   Received from  [employer]   HITS    Physically Hurt: Not on file    Insult or Talk Down To: Not on file    Threaten Physical Harm: Not on file    Scream or Curse: Not on file    Outpatient Medications Prior to Visit  Medication Sig Dispense Refill   amLODipine  (NORVASC ) 10 MG tablet Take 1 tablet (10 mg total) by mouth daily. 90 tablet 1   atorvastatin  (LIPITOR) 80 MG tablet Take 1 tablet (80 mg total) by mouth daily. 90 tablet 1   betamethasone  dipropionate 0.05 % cream Apply topically 2 (two) times daily. 30 g 0   blood glucose meter kit and supplies KIT Use up to four times daily as directed to check blood sugar 1 each 0   carvedilol  (COREG ) 25 MG tablet Take 1 tablet (25 mg total) by mouth 2 (two) times daily with a meal. 60 tablet 3   escitalopram  (LEXAPRO ) 20 MG tablet Take 1 tablet (20 mg total) by mouth daily. 90 tablet 1   fluticasone  (FLONASE ) 50 MCG/ACT nasal spray PLACE 2 SPRAYS INTO BOTH NOSTRILS DAILY 16 g 5   glucose blood (CONTOUR NEXT TEST) test strip Use up to 4 times a day as directed to check blood sugar 100 each 12   hydrALAZINE  (APRESOLINE ) 10 MG tablet Take 1 tablet (10 mg total) by mouth 3 (three) times daily. 90 tablet 2   loratadine  (CLARITIN ) 10 MG tablet Take 1 tablet (10 mg total) by mouth daily.     Microlet Lancets MISC Use up to 4 times a day as directed to check blood sugar 100 each 11   Multiple Vitamins-Minerals (MULTIVITAMIN WITH MINERALS) tablet Take 1 tablet by mouth daily. 30 tablet    pioglitazone  (ACTOS ) 30 MG tablet Take 1 tablet (30 mg total) by mouth daily. 90 tablet 1   SUMAtriptan  (IMITREX ) 100 MG tablet TAKE ONE TABLET BY MOUTH AT START OF HEADACHE, MAY REPEAT 2 HOURS LATER AS NEEDED. MAX 2 DOSE PER 24 HOURS 10 tablet 5   Tazarotene (ARAZLO) 0.045 % LOTN Apply topically.     Triamcinolone  Acetonide (KENALOG IJ) Inject 60 mg as directed. As needed or Every 1 1/2 months for Alopecia     zolpidem  (AMBIEN  CR) 12.5 MG CR tablet Take 1 tablet  (12.5 mg total) by mouth at bedtime as needed. 30 tablet 2   hydrochlorothiazide  (HYDRODIURIL ) 25 MG tablet Take 1 tablet (25 mg total) by mouth daily. 90 tablet 1   sitaGLIPtin  (JANUVIA ) 100 MG tablet Take 1 tablet (100 mg total) by mouth daily. 30 tablet 5   etonogestrel  (NEXPLANON ) 68 MG IMPL implant  1 each (68 mg total) by Subdermal route once for 1 dose. 1 each 0   No facility-administered medications prior to visit.    Allergies  Allergen Reactions   Ace Inhibitors Swelling    angioedema    Lisinopril Swelling   Metformin And Related     ROS See HPI    Objective:     Physical Exam Constitutional:      General: She is not in acute distress.    Appearance: Normal appearance. She is well-developed.  HENT:     Head: Normocephalic and atraumatic.     Right Ear: External ear normal.     Left Ear: External ear normal.  Eyes:     General: No scleral icterus. Neck:     Thyroid : No thyromegaly.  Cardiovascular:     Rate and Rhythm: Normal rate and regular rhythm.     Heart sounds: Normal heart sounds. No murmur heard. Pulmonary:     Effort: Pulmonary effort is normal. No respiratory distress.     Breath sounds: Normal breath sounds. No wheezing.  Musculoskeletal:     Cervical back: Neck supple.  Skin:    General: Skin is warm and dry.  Neurological:     Mental Status: She is alert and oriented to person, place, and time.  Psychiatric:        Mood and Affect: Mood normal.        Behavior: Behavior normal.        Thought Content: Thought content normal.        Judgment: Judgment normal.      BP 126/75 (BP Location: Right Arm, Patient Position: Sitting, Cuff Size: Large)   Pulse 85   Temp 99 F (37.2 C) (Oral)   Resp 16   Ht 5\' 4"  (1.626 m)   Wt 161 lb (73 kg)   SpO2 99%   BMI 27.64 kg/m  Wt Readings from Last 3 Encounters:  09/13/23 161 lb (73 kg)  07/05/23 162 lb (73.5 kg)  05/17/23 163 lb (73.9 kg)       Assessment & Plan:   Problem List Items  Addressed This Visit       Unprioritized   Type 2 diabetes mellitus without complication, without long-term current use of insulin (HCC)   Lab Results  Component Value Date   HGBA1C 7.4 (H) 07/05/2023   HGBA1C 7.2 (H) 03/29/2023   HGBA1C 6.6 (H) 12/14/2022   Lab Results  Component Value Date   MICROALBUR 2.6 (H) 12/14/2022   LDLCALC 144 (H) 07/05/2023   CREATININE 0.69 07/05/2023   Could not afford Januvia .  Instead focusing on diet.  Will plan to check A1C next visit.       Essential hypertension - Primary   Improved with addition of low dose hydralazine  tid in addition to hydrochlorothiazide , carvedilol  and amlodipine .  Continue improved lifestyle changes as well.        Relevant Medications   hydrochlorothiazide  (HYDRODIURIL ) 25 MG tablet   Encounter for contraceptive management   Will need nexplanon  exchange in November.        I have discontinued Deshawnda Caldwell's sitaGLIPtin . I am also having her maintain her Triamcinolone  Acetonide (KENALOG IJ), multivitamin with minerals, Arazlo, Nexplanon , Contour Next Test, Microlet Lancets, fluticasone , SUMAtriptan , blood glucose meter kit and supplies, betamethasone  dipropionate, amLODipine , escitalopram , carvedilol , pioglitazone , hydrALAZINE , loratadine , zolpidem , atorvastatin , and hydrochlorothiazide .  Meds ordered this encounter  Medications   hydrochlorothiazide  (HYDRODIURIL ) 25 MG tablet    Sig: Take 1 tablet (25  mg total) by mouth daily.    Dispense:  90 tablet    Refill:  1    Supervising Provider:   Randie Bustle A [[id number]]

## 2023-09-14 DIAGNOSIS — Z309 Encounter for contraceptive management, unspecified: Secondary | ICD-10-CM | POA: Insufficient documentation

## 2023-09-14 NOTE — Assessment & Plan Note (Signed)
 Improved with addition of low dose hydralazine  tid in addition to hydrochlorothiazide , carvedilol  and amlodipine .  Continue improved lifestyle changes as well.

## 2023-09-14 NOTE — Assessment & Plan Note (Signed)
 Will need nexplanon  exchange in November.

## 2023-09-14 NOTE — Patient Instructions (Signed)
 VISIT SUMMARY:  Today, we discussed managing your diabetes and hypertension through lifestyle changes, and we also planned for your Nexplanon  renewal.  YOUR PLAN:  TYPE 2 DIABETES MELLITUS: You have diabetes, and are focusing on lifestyle changes to manage it since you cannot afford Januvia . -We will remove Januvia  from your medication list. -We will reassess your A1c levels in 1-2 months.  HYPERTENSION: You have high blood pressure, and we are focusing on lifestyle changes to manage it. -Continue with stress reduction and dietary improvements.  NEXPLANON  RENEWAL: Your Nexplanon  is due for renewal in November 2025. -We will schedule your Nexplanon  renewal for early November 2025.

## 2023-09-14 NOTE — Assessment & Plan Note (Signed)
 Lab Results  Component Value Date   HGBA1C 7.4 (H) 07/05/2023   HGBA1C 7.2 (H) 03/29/2023   HGBA1C 6.6 (H) 12/14/2022   Lab Results  Component Value Date   MICROALBUR 2.6 (H) 12/14/2022   LDLCALC 144 (H) 07/05/2023   CREATININE 0.69 07/05/2023   Could not afford Januvia .  Instead focusing on diet.  Will plan to check A1C next visit.

## 2023-11-14 ENCOUNTER — Other Ambulatory Visit: Payer: Self-pay | Admitting: Family

## 2023-11-15 ENCOUNTER — Other Ambulatory Visit: Payer: Self-pay

## 2023-11-15 ENCOUNTER — Other Ambulatory Visit (HOSPITAL_BASED_OUTPATIENT_CLINIC_OR_DEPARTMENT_OTHER): Payer: Self-pay

## 2023-11-15 ENCOUNTER — Ambulatory Visit: Admitting: Family

## 2023-11-15 MED ORDER — FLUTICASONE PROPIONATE 50 MCG/ACT NA SUSP
2.0000 | Freq: Every day | NASAL | 5 refills | Status: DC
  Filled 2023-11-15: qty 16, 30d supply, fill #0

## 2023-11-22 ENCOUNTER — Ambulatory Visit: Admitting: Family

## 2023-11-29 ENCOUNTER — Ambulatory Visit: Admitting: Family

## 2023-11-29 ENCOUNTER — Telehealth: Payer: Self-pay | Admitting: Family

## 2023-11-29 ENCOUNTER — Other Ambulatory Visit (HOSPITAL_BASED_OUTPATIENT_CLINIC_OR_DEPARTMENT_OTHER): Payer: Self-pay

## 2023-11-29 VITALS — BP 122/85 | HR 87 | Resp 16 | Ht 64.0 in | Wt 153.0 lb

## 2023-11-29 DIAGNOSIS — Z794 Long term (current) use of insulin: Secondary | ICD-10-CM

## 2023-11-29 DIAGNOSIS — G5603 Carpal tunnel syndrome, bilateral upper limbs: Secondary | ICD-10-CM | POA: Diagnosis not present

## 2023-11-29 DIAGNOSIS — I10 Essential (primary) hypertension: Secondary | ICD-10-CM | POA: Diagnosis not present

## 2023-11-29 DIAGNOSIS — R809 Proteinuria, unspecified: Secondary | ICD-10-CM

## 2023-11-29 DIAGNOSIS — G43909 Migraine, unspecified, not intractable, without status migrainosus: Secondary | ICD-10-CM | POA: Diagnosis not present

## 2023-11-29 DIAGNOSIS — E119 Type 2 diabetes mellitus without complications: Secondary | ICD-10-CM | POA: Diagnosis not present

## 2023-11-29 DIAGNOSIS — E785 Hyperlipidemia, unspecified: Secondary | ICD-10-CM

## 2023-11-29 DIAGNOSIS — F419 Anxiety disorder, unspecified: Secondary | ICD-10-CM

## 2023-11-29 DIAGNOSIS — Z1231 Encounter for screening mammogram for malignant neoplasm of breast: Secondary | ICD-10-CM

## 2023-11-29 DIAGNOSIS — G4700 Insomnia, unspecified: Secondary | ICD-10-CM

## 2023-11-29 DIAGNOSIS — F32A Depression, unspecified: Secondary | ICD-10-CM

## 2023-11-29 LAB — BASIC METABOLIC PANEL WITH GFR
BUN: 15 mg/dL (ref 6–23)
CO2: 30 meq/L (ref 19–32)
Calcium: 9.4 mg/dL (ref 8.4–10.5)
Chloride: 94 meq/L — ABNORMAL LOW (ref 96–112)
Creatinine, Ser: 0.94 mg/dL (ref 0.40–1.20)
GFR: 70.97 mL/min (ref >60.00)
Glucose, Bld: 441 mg/dL — ABNORMAL HIGH (ref 70–99)
Potassium: 4 meq/L (ref 3.5–5.1)
Sodium: 133 meq/L — ABNORMAL LOW (ref 135–145)

## 2023-11-29 LAB — MICROALBUMIN / CREATININE URINE RATIO
Creatinine,U: 205.7 mg/dL
Microalb Creat Ratio: 31.8 mg/g — ABNORMAL HIGH (ref 0.0–30.0)
Microalb, Ur: 6.5 mg/dL — ABNORMAL HIGH (ref 0.0–1.9)

## 2023-11-29 LAB — HEMOGLOBIN A1C: Hgb A1c MFr Bld: 11.6 % — ABNORMAL HIGH (ref 4.6–6.5)

## 2023-11-29 MED ORDER — AMLODIPINE BESYLATE 10 MG PO TABS
10.0000 mg | ORAL_TABLET | Freq: Every day | ORAL | 1 refills | Status: AC
Start: 2023-11-29 — End: ?
  Filled 2023-11-29 (×2): qty 90, 90d supply, fill #0
  Filled 2024-03-04: qty 90, 90d supply, fill #1

## 2023-11-29 MED ORDER — INSULIN PEN NEEDLE 31G X 5 MM MISC
4 refills | Status: AC
  Filled 2023-11-29 (×2): qty 100, 90d supply, fill #0
  Filled 2024-03-04: qty 100, 90d supply, fill #1

## 2023-11-29 MED ORDER — FREESTYLE LIBRE 3 PLUS SENSOR MISC
5 refills | Status: AC
  Filled 2023-11-29 (×2): qty 6, 90d supply, fill #0
  Filled 2024-03-04: qty 4, 56d supply, fill #1
  Filled 2024-03-06: qty 4, 60d supply, fill #1
  Filled 2024-04-17: qty 2, 28d supply, fill #2
  Filled 2024-04-17: qty 1, 15d supply, fill #2
  Filled 2024-05-01: qty 1, 15d supply, fill #3
  Filled 2024-05-01 – 2024-05-08 (×2): qty 2, 30d supply, fill #3

## 2023-11-29 MED ORDER — PIOGLITAZONE HCL 30 MG PO TABS
30.0000 mg | ORAL_TABLET | Freq: Every day | ORAL | 1 refills | Status: AC
  Filled 2023-11-29: qty 90, 90d supply, fill #0

## 2023-11-29 MED ORDER — MELOXICAM 7.5 MG PO TABS
7.5000 mg | ORAL_TABLET | Freq: Every day | ORAL | 0 refills | Status: AC
  Filled 2023-11-29: qty 14, 14d supply, fill #0

## 2023-11-29 MED ORDER — BASAGLAR TEMPO PEN 100 UNIT/ML ~~LOC~~ SOPN
10.0000 [IU] | PEN_INJECTOR | SUBCUTANEOUS | 3 refills | Status: AC
Start: 2023-11-29 — End: ?
  Filled 2023-11-29 (×2): qty 3, 90d supply, fill #0

## 2023-11-29 NOTE — Telephone Encounter (Signed)
 Please request DM eye report from [facility].

## 2023-11-29 NOTE — Progress Notes (Signed)
 Subjective:     Patient ID: Lindsey Burton, female    DOB: 24-Mar-1976, 48 y.o.   MRN: [id number]  Chief Complaint  Patient presents with   Hypertension    Here for follow up   Diabetes    Here for follow up    Hypertension  Diabetes    Discussed the use of AI scribe software for clinical note transcription with the patient, who gave verbal consent to proceed.  History of Present Illness  Lindsey Burton is a 48 year old female who presents for follow-up on her diabetes management and other health concerns.  She manages her diabetes with pioglitazone  and has made dietary changes, including eliminating snacks. Her last HbA1c was 7.4. She experiences numbness in her fingers, which persists despite discontinuing atorvastatin , affecting her ability to type. She suspects carpal tunnel syndrome. She has restarted atorvastatin  after determining it was not the cause of her numbness.  She is due for a colonoscopy but has faced scheduling difficulties. Her last eye exam was within the past year, but results have not been sent to her current provider. She believes she had a mammogram since February 2023, but records indicate otherwise. She is considering updating her immunizations, including pneumonia, shingles, and hepatitis B vaccines.  Her migraines have improved significantly with hormonal changes from an implant. She experiences insomnia and uses Ambien  to aid sleep, though not nightly. She takes Lexapro  for mood management and feels 'okay', considering more consistent use.     Health Maintenance Due  Topic Date Due   OPHTHALMOLOGY EXAM  Never done   Diabetic kidney evaluation - Urine ACR  Never done   Pneumococcal Vaccine: 19-49 Years (1 of 2 - PCV) Never done   Pneumococcal Vaccine: 50+ Years (1 of 2 - PCV) Never done   Hepatitis B Vaccines (1 of 3 - 19+ 3-dose series) Never done   Colonoscopy  Never done   COVID-19 Vaccine (3 - 2024-25 season) 12/18/2022   MAMMOGRAM   10/30/2023   Zoster Vaccines- Shingrix (1 of 2) Never done   INFLUENZA VACCINE  11/17/2023   Cervical Cancer Screening (HPV/Pap Cotest)  12/18/2023    Past Medical History:  Diagnosis Date   Alopecia    Getting kenalog injections prn   Anxiety    Depression    Hypertension    Insomnia    Migraines    Migraines    Vitamin D  deficiency     Past Surgical History:  Procedure Laterality Date   LIPOSUCTION  09/2017    Family History  Problem Relation Age of Onset   Hypertension Father    Diabetes Father    Hyperlipidemia Father    Heart attack Father    Diabetes Sister    Hypertension Sister    Cancer Neg Hx     Social History   Socioeconomic History   Marital status: Divorced    Spouse name: Not on file   Number of children: Not on file   Years of education: Not on file   Highest education level: Some college, no degree  Occupational History   Not on file  Tobacco Use   Smoking status: Never   Smokeless tobacco: Never  Substance and Sexual Activity   Alcohol use: No   Drug use: No   Sexual activity: Yes    Partners: Male    Birth control/protection: Implant  Other Topics Concern   Not on file  Social History Narrative   Daughter, age 82 autistic Does well  in school   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   Divorced, lives with daughter   Enjoys TV   Has fish   Social Drivers of Health   Financial Resource Strain: Low Risk  (11/28/2023)   Overall Financial Resource Strain (CARDIA)    Difficulty of Paying Living Expenses: Not very hard  Food Insecurity: No Food Insecurity (11/28/2023)   Hunger Vital Sign    Worried About Running Out of Food in the Last Year: Never true    Ran Out of Food in the Last Year: Never true  Transportation Needs: No Transportation Needs (11/28/2023)   PRAPARE - Administrator, Civil Service (Medical): No    Lack of Transportation (Non-Medical): No  Physical Activity: Insufficiently Active  (11/28/2023)   Exercise Vital Sign    Days of Exercise per Week: 3 days    Minutes of Exercise per Session: 30 min  Stress: No Stress Concern Present (11/28/2023)   [employer] of Occupational Health - Occupational Stress Questionnaire    Feeling of Stress: Only a little  Social Connections: Socially Isolated (11/28/2023)   Social Connection and Isolation Panel    Frequency of Communication with Friends and Family: More than three times a week    Frequency of Social Gatherings with Friends and Family: Once a week    Attends Religious Services: Never    Database administrator or Organizations: No    Attends Engineer, structural: Not on file    Marital Status: Divorced  Intimate Partner Violence: Unknown (07/19/2021)   Received from [facility]   HITS    Physically Hurt: Not on file    Insult or Talk Down To: Not on file    Threaten Physical Harm: Not on file    Scream or Curse: Not on file    Outpatient Medications Prior to Visit  Medication Sig Dispense Refill   betamethasone  dipropionate 0.05 % cream Apply topically 2 (two) times daily. 30 g 0   blood glucose meter kit and supplies KIT Use up to four times daily as directed to check blood sugar 1 each 0   carvedilol  (COREG ) 25 MG tablet Take 1 tablet (25 mg total) by mouth 2 (two) times daily with a meal. 60 tablet 3   escitalopram  (LEXAPRO ) 20 MG tablet Take 1 tablet (20 mg total) by mouth daily. 90 tablet 1   etonogestrel  (NEXPLANON ) 68 MG IMPL implant 1 each (68 mg total) by Subdermal route once for 1 dose. 1 each 0   fluticasone  (FLONASE ) 50 MCG/ACT nasal spray PLACE 2 SPRAYS INTO BOTH NOSTRILS DAILY 16 g 5   glucose blood (CONTOUR NEXT TEST) test strip Use up to 4 times a day as directed to check blood sugar 100 each 12   hydrALAZINE  (APRESOLINE ) 10 MG tablet Take 1 tablet (10 mg total) by mouth 3 (three) times daily. 90 tablet 2   hydrochlorothiazide  (HYDRODIURIL ) 25 MG tablet Take 1 tablet (25 mg total) by mouth  daily. 90 tablet 1   loratadine  (CLARITIN ) 10 MG tablet Take 1 tablet (10 mg total) by mouth daily.     Microlet Lancets MISC Use up to 4 times a day as directed to check blood sugar 100 each 11   Multiple Vitamins-Minerals (MULTIVITAMIN WITH MINERALS) tablet Take 1 tablet by mouth daily. 30 tablet    SUMAtriptan  (IMITREX ) 100 MG tablet TAKE ONE TABLET BY MOUTH AT START OF HEADACHE, MAY REPEAT 2 HOURS LATER  AS NEEDED. MAX 2 DOSE PER 24 HOURS 10 tablet 5   Tazarotene (ARAZLO) 0.045 % LOTN Apply topically.     Triamcinolone  Acetonide (KENALOG IJ) Inject 60 mg as directed. As needed or Every 1 1/2 months for Alopecia     zolpidem  (AMBIEN  CR) 12.5 MG CR tablet Take 1 tablet (12.5 mg total) by mouth at bedtime as needed. 30 tablet 2   amLODipine  (NORVASC ) 10 MG tablet Take 1 tablet (10 mg total) by mouth daily. 90 tablet 1   pioglitazone  (ACTOS ) 30 MG tablet Take 1 tablet (30 mg total) by mouth daily. 90 tablet 1   atorvastatin  (LIPITOR) 80 MG tablet Take 1 tablet (80 mg total) by mouth daily. 90 tablet 1   No facility-administered medications prior to visit.    Allergies  Allergen Reactions   Ace Inhibitors Swelling    angioedema    Lisinopril Swelling   Metformin And Related     ROS     Objective:    Physical Exam Constitutional:      General: She is not in acute distress.    Appearance: Normal appearance. She is well-developed.  HENT:     Head: Normocephalic and atraumatic.     Right Ear: External ear normal.     Left Ear: External ear normal.  Eyes:     General: No scleral icterus. Neck:     Thyroid : No thyromegaly.  Cardiovascular:     Rate and Rhythm: Normal rate and regular rhythm.     Heart sounds: Normal heart sounds. No murmur heard. Pulmonary:     Effort: Pulmonary effort is normal. No respiratory distress.     Breath sounds: Normal breath sounds. No wheezing.  Musculoskeletal:     Cervical back: Neck supple.  Skin:    General: Skin is warm and dry.   Neurological:     Mental Status: She is alert and oriented to person, place, and time.  Psychiatric:        Mood and Affect: Mood normal.        Behavior: Behavior normal.        Thought Content: Thought content normal.        Judgment: Judgment normal.      BP 122/85 (BP Location: Right Arm, Patient Position: Sitting, Cuff Size: Normal)   Pulse 87   Resp 16   Ht 5' 4 (1.626 m)   Wt 153 lb (69.4 kg)   SpO2 98%   BMI 26.26 kg/m  Wt Readings from Last 3 Encounters:  11/29/23 153 lb (69.4 kg)  09/13/23 161 lb (73 kg)  07/05/23 162 lb (73.5 kg)       Assessment & Plan:   Problem List Items Addressed This Visit       Unprioritized   Type 2 diabetes mellitus without complication, without long-term current use of insulin  (HCC) - Primary   Lab Results  Component Value Date   HGBA1C 7.4 (H) 07/05/2023   HGBA1C 7.2 (H) 03/29/2023   HGBA1C 6.6 (H) 12/14/2022   Lab Results  Component Value Date   LDLCALC 144 (H) 07/05/2023   CREATININE 0.69 07/05/2023   Maintained on actos .       Relevant Medications   pioglitazone  (ACTOS ) 30 MG tablet   Other Relevant Orders   HgB A1c   Urine Microalbumin w/creat. ratio   Migraines   No recent migraines.  Feels that nexplanon  has been helpful.      Relevant Medications   meloxicam  (MOBIC ) 7.5 MG  tablet   amLODipine  (NORVASC ) 10 MG tablet   Insomnia   Ambien  helps her stay asleep, taking prn. Update controlled substance contract. Obtain UDS.      Relevant Orders   DRUG MONITORING, PANEL 8 WITH CONFIRMATION, URINE   Hyperlipidemia   Lab Results  Component Value Date   CHOL 217 (H) 07/05/2023   HDL 47.90 07/05/2023   LDLCALC 144 (H) 07/05/2023   TRIG 124.0 07/05/2023   CHOLHDL 5 07/05/2023   Stopped lipitor briefly because she thought it was causing hand numbness. Advised to restart. Update lipids next visit.       Relevant Medications   amLODipine  (NORVASC ) 10 MG tablet   Essential hypertension   BP Readings from  Last 3 Encounters:  11/29/23 122/85  09/13/23 126/75  07/05/23 (!) 148/88   BP stable on amlodipine , hydralazine , hydrochlorothiazide , carvedilol .       Relevant Medications   amLODipine  (NORVASC ) 10 MG tablet   Other Relevant Orders   Basic Metabolic Panel (BMET)   Bilateral carpal tunnel syndrome   Sxs most consistent with CTS. Recommend wrist braces HS and as able during the day.  Rx with meloxicam .      Relevant Medications   meloxicam  (MOBIC ) 7.5 MG tablet   Anxiety and depression   Maintained on lexapro .       Relevant Orders   Ambulatory referral to Gastroenterology   Other Visit Diagnoses       Breast cancer screening by mammogram       Relevant Orders   MM 3D DIAGNOSTIC MAMMOGRAM BILATERAL BREAST       I am having Dulcy Bunker start on meloxicam . I am also having her maintain her Triamcinolone  Acetonide (KENALOG IJ), multivitamin with minerals, Arazlo, Nexplanon , Contour Next Test, Microlet Lancets, SUMAtriptan , blood glucose meter kit and supplies, betamethasone  dipropionate, escitalopram , carvedilol , hydrALAZINE , loratadine , zolpidem , atorvastatin , hydrochlorothiazide , fluticasone , pioglitazone , and amLODipine .  Meds ordered this encounter  Medications   meloxicam  (MOBIC ) 7.5 MG tablet    Sig: Take 1 tablet (7.5 mg total) by mouth daily.    Dispense:  14 tablet    Refill:  0    Supervising Provider:   DOMENICA BLACKBIRD A [[id number]]   pioglitazone  (ACTOS ) 30 MG tablet    Sig: Take 1 tablet (30 mg total) by mouth daily.    Dispense:  90 tablet    Refill:  1    Supervising Provider:   DOMENICA BLACKBIRD A [[id number]]   amLODipine  (NORVASC ) 10 MG tablet    Sig: Take 1 tablet (10 mg total) by mouth daily.    Dispense:  90 tablet    Refill:  1    Supervising Provider:   DOMENICA BLACKBIRD A [[id number]]

## 2023-11-29 NOTE — Assessment & Plan Note (Addendum)
 BP Readings from Last 3 Encounters:  11/29/23 122/85  09/13/23 126/75  07/05/23 (!) 148/88   BP stable on amlodipine , hydralazine , hydrochlorothiazide , carvedilol .

## 2023-11-29 NOTE — Patient Instructions (Signed)
 VISIT SUMMARY:  Today, we discussed your diabetes management, numbness in your fingers, and other health concerns. We also reviewed your medications and upcoming health screenings and vaccinations.  YOUR PLAN:  CARPAL TUNNEL SYNDROME: You have numbness and tingling in your fingers, likely due to repetitive motion and poor sleeping posture. -Use wrist splints during sleep and non-working hours. -Consider taking a short-term anti-inflammatory medication.  TYPE 2 DIABETES MELLITUS: Your diabetes is managed with pioglitazone , and your last HbA1c was 7.4, which is slightly above the target. -A refill for pioglitazone  has been sent.  HYPERTENSION: Your blood pressure is well-controlled with your current medication regimen. -Continue your current blood pressure medications.  HYPERLIPIDEMIA: You restarted atorvastatin  as it was not the cause of your numbness. -Continue taking atorvastatin . -We will check your cholesterol levels at your next physical.  INSOMNIA: You have chronic insomnia and use Ambien  to help you sleep. -We will update your controlled substance contract for Ambien .  Mood: Your mood is well-managed with Lexapro , though you do not take it daily. -Consider taking Lexapro  more consistently.  GENERAL HEALTH MAINTENANCE: You are due for several vaccinations and screenings. -We will request your eye exam records from [facility] in [facility]. -A new mammogram has been ordered. -We will discuss pneumonia, shingles, and hepatitis B vaccinations at your next physical.

## 2023-11-29 NOTE — Assessment & Plan Note (Signed)
 Sxs most consistent with CTS. Recommend wrist braces HS and as able during the day.  Rx with meloxicam .

## 2023-11-29 NOTE — Assessment & Plan Note (Signed)
 Maintained on lexapro .

## 2023-11-29 NOTE — Assessment & Plan Note (Signed)
 Lab Results  Component Value Date   HGBA1C 7.4 (H) 07/05/2023   HGBA1C 7.2 (H) 03/29/2023   HGBA1C 6.6 (H) 12/14/2022   Lab Results  Component Value Date   LDLCALC 144 (H) 07/05/2023   CREATININE 0.69 07/05/2023   Maintained on actos .

## 2023-11-29 NOTE — Assessment & Plan Note (Signed)
 Ambien  helps her stay asleep, taking prn. Update controlled substance contract. Obtain UDS.

## 2023-11-29 NOTE — Assessment & Plan Note (Signed)
 Lab Results  Component Value Date   CHOL 217 (H) 07/05/2023   HDL 47.90 07/05/2023   LDLCALC 144 (H) 07/05/2023   TRIG 124.0 07/05/2023   CHOLHDL 5 07/05/2023   Stopped lipitor briefly because she thought it was causing hand numbness. Advised to restart. Update lipids next visit.

## 2023-11-29 NOTE — Assessment & Plan Note (Signed)
 No recent migraines.  Feels that nexplanon  has been helpful.

## 2023-11-29 NOTE — Telephone Encounter (Signed)
 Electronic request made

## 2023-11-29 NOTE — Telephone Encounter (Signed)
 A1C is extremely high at 11.6.  Last time we checked it was 7.4.  I would like her to add basaglar  insulin  once daily. Also, I sent an rx for the freestyle continuous glucose monitor. It looks like she will need a PA but we will try.  Check fasting blood sugar daily for 3 days after starting basaglar  and send me readings via mychart.  We can schedule her a nurse visit for teaching.   Also, she has protein in her urine from the diabetes.  She can't take the lisinopril we usually prescribe for this so it is very important that she get her sugars down into a safe range.

## 2023-11-30 ENCOUNTER — Other Ambulatory Visit (HOSPITAL_BASED_OUTPATIENT_CLINIC_OR_DEPARTMENT_OTHER): Payer: Self-pay

## 2023-11-30 LAB — DRUG MONITORING, PANEL 8 WITH CONFIRMATION, URINE
6 Acetylmorphine: NEGATIVE ng/mL (ref <10)
Alcohol Metabolites: NEGATIVE ng/mL (ref <500)
Amphetamines: NEGATIVE ng/mL (ref <500)
Benzodiazepines: NEGATIVE ng/mL (ref <100)
Buprenorphine, Urine: NEGATIVE ng/mL (ref <5)
Cocaine Metabolite: NEGATIVE ng/mL (ref <150)
Creatinine: 218.3 mg/dL (ref >=20.0)
MDMA: NEGATIVE ng/mL (ref <500)
Marijuana Metabolite: NEGATIVE ng/mL (ref <20)
Opiates: NEGATIVE ng/mL (ref <100)
Oxidant: NEGATIVE ug/mL (ref <200)
Oxycodone: NEGATIVE ng/mL (ref <100)
pH: 6.4 (ref 4.5–9.0)

## 2023-11-30 LAB — DM TEMPLATE

## 2023-11-30 NOTE — Telephone Encounter (Signed)
 Patient notified of results, new prescription, comments and recommendations.  She will let us  know if she needs assistance learning how to use the basaglar .  She will send fasting readings to provider after starting this.

## 2023-12-01 ENCOUNTER — Other Ambulatory Visit (HOSPITAL_BASED_OUTPATIENT_CLINIC_OR_DEPARTMENT_OTHER): Payer: Self-pay

## 2023-12-01 ENCOUNTER — Ambulatory Visit: Payer: Self-pay | Admitting: Family

## 2023-12-04 ENCOUNTER — Encounter: Payer: Self-pay | Admitting: Family

## 2023-12-04 ENCOUNTER — Other Ambulatory Visit (HOSPITAL_BASED_OUTPATIENT_CLINIC_OR_DEPARTMENT_OTHER): Payer: Self-pay

## 2023-12-05 ENCOUNTER — Encounter: Payer: Self-pay | Admitting: Family

## 2023-12-05 NOTE — Telephone Encounter (Signed)
 Patient came in to the office and was instructed on how to use the medication

## 2023-12-07 ENCOUNTER — Telehealth: Payer: Self-pay | Admitting: Family

## 2023-12-07 DIAGNOSIS — E119 Type 2 diabetes mellitus without complications: Secondary | ICD-10-CM

## 2023-12-07 MED ORDER — BASAGLAR TEMPO PEN 100 UNIT/ML ~~LOC~~ SOPN: 14.0000 [IU] | PEN_INJECTOR | SUBCUTANEOUS | Status: DC

## 2023-12-07 NOTE — Telephone Encounter (Signed)
 Please advise pt that I have reviewed her recent sugars.  I would like for her to increase her Basaglar  to 14 units daily.  I will continue to monitor her sugar and likely increase further early next week.

## 2023-12-08 DIAGNOSIS — R809 Proteinuria, unspecified: Secondary | ICD-10-CM

## 2023-12-08 NOTE — Telephone Encounter (Signed)
 Unable to reach patient on the telephone, mychart message sent

## 2023-12-11 ENCOUNTER — Other Ambulatory Visit (HOSPITAL_BASED_OUTPATIENT_CLINIC_OR_DEPARTMENT_OTHER): Payer: Self-pay

## 2023-12-11 MED ORDER — DAPAGLIFLOZIN PROPANEDIOL 5 MG PO TABS
5.0000 mg | ORAL_TABLET | Freq: Every day | ORAL | 5 refills | Status: AC
  Filled 2023-12-11: qty 30, 30d supply, fill #0

## 2023-12-11 NOTE — Telephone Encounter (Signed)
 Patient confirmed getting all this information and she is now using 14 units daily

## 2023-12-13 ENCOUNTER — Encounter: Payer: Self-pay | Admitting: Family

## 2023-12-15 ENCOUNTER — Ambulatory Visit

## 2023-12-15 ENCOUNTER — Other Ambulatory Visit: Payer: Self-pay

## 2023-12-15 ENCOUNTER — Other Ambulatory Visit (HOSPITAL_BASED_OUTPATIENT_CLINIC_OR_DEPARTMENT_OTHER): Payer: Self-pay

## 2023-12-15 ENCOUNTER — Encounter: Payer: Self-pay | Admitting: Family

## 2023-12-15 DIAGNOSIS — E119 Type 2 diabetes mellitus without complications: Secondary | ICD-10-CM

## 2023-12-15 MED ORDER — BASAGLAR TEMPO PEN 100 UNIT/ML ~~LOC~~ SOPN
14.0000 [IU] | PEN_INJECTOR | Freq: Every day | SUBCUTANEOUS | 3 refills | Status: DC
  Filled 2023-12-15: qty 6, 43d supply, fill #0
  Filled 2023-12-15: qty 6, fill #0

## 2023-12-19 ENCOUNTER — Telehealth: Payer: Self-pay | Admitting: Family

## 2023-12-19 DIAGNOSIS — E119 Type 2 diabetes mellitus without complications: Secondary | ICD-10-CM

## 2023-12-19 MED ORDER — BASAGLAR TEMPO PEN 100 UNIT/ML ~~LOC~~ SOPN: 16.0000 [IU] | PEN_INJECTOR | Freq: Every day | SUBCUTANEOUS | Status: DC

## 2023-12-19 NOTE — Telephone Encounter (Signed)
 See mychart.

## 2023-12-21 ENCOUNTER — Other Ambulatory Visit (HOSPITAL_BASED_OUTPATIENT_CLINIC_OR_DEPARTMENT_OTHER): Payer: Self-pay

## 2023-12-25 ENCOUNTER — Telehealth: Payer: Self-pay | Admitting: Family

## 2023-12-25 NOTE — Telephone Encounter (Signed)
 See mychart.

## 2023-12-28 ENCOUNTER — Ambulatory Visit (HOSPITAL_BASED_OUTPATIENT_CLINIC_OR_DEPARTMENT_OTHER)
Admission: RE | Admit: 2023-12-28 | Discharge: 2023-12-28 | Disposition: A | Discharge status: Home / Self Care | Source: Ambulatory Visit | Attending: Family | Admitting: Family

## 2023-12-28 ENCOUNTER — Ambulatory Visit

## 2023-12-28 ENCOUNTER — Encounter (HOSPITAL_BASED_OUTPATIENT_CLINIC_OR_DEPARTMENT_OTHER): Payer: Self-pay

## 2023-12-28 DIAGNOSIS — Z1231 Encounter for screening mammogram for malignant neoplasm of breast: Secondary | ICD-10-CM | POA: Insufficient documentation

## 2024-01-17 ENCOUNTER — Other Ambulatory Visit (HOSPITAL_BASED_OUTPATIENT_CLINIC_OR_DEPARTMENT_OTHER): Payer: Self-pay

## 2024-01-17 ENCOUNTER — Other Ambulatory Visit (HOSPITAL_COMMUNITY)
Admission: RE | Admit: 2024-01-17 | Discharge: 2024-01-17 | Disposition: A | Source: Ambulatory Visit | Attending: Family | Admitting: Family

## 2024-01-17 ENCOUNTER — Ambulatory Visit (INDEPENDENT_AMBULATORY_CARE_PROVIDER_SITE_OTHER): Admitting: Family

## 2024-01-17 ENCOUNTER — Other Ambulatory Visit: Payer: Self-pay | Admitting: *Deleted

## 2024-01-17 VITALS — BP 131/74 | HR 85 | Temp 99.3°F | Resp 16 | Ht 62.0 in | Wt 154.0 lb

## 2024-01-17 DIAGNOSIS — E1129 Type 2 diabetes mellitus with other diabetic kidney complication: Secondary | ICD-10-CM | POA: Diagnosis not present

## 2024-01-17 DIAGNOSIS — R809 Proteinuria, unspecified: Secondary | ICD-10-CM | POA: Diagnosis not present

## 2024-01-17 DIAGNOSIS — Z01419 Encounter for gynecological examination (general) (routine) without abnormal findings: Secondary | ICD-10-CM | POA: Insufficient documentation

## 2024-01-17 DIAGNOSIS — Z7984 Long term (current) use of oral hypoglycemic drugs: Secondary | ICD-10-CM

## 2024-01-17 DIAGNOSIS — E119 Type 2 diabetes mellitus without complications: Secondary | ICD-10-CM

## 2024-01-17 MED ORDER — FLUTICASONE PROPIONATE 50 MCG/ACT NA SUSP
2.0000 | Freq: Every day | NASAL | 1 refills | Status: AC
  Filled 2024-01-17 – 2024-01-31 (×2): qty 48, 90d supply, fill #0
  Filled 2024-05-01: qty 48, 90d supply, fill #1

## 2024-01-17 NOTE — Patient Instructions (Signed)
 VISIT SUMMARY:  You came in for a follow-up on your blood sugar management. We discussed your recent experiences with morning blood sugar spikes, dietary adjustments, and recent infections.  YOUR PLAN:  TYPE 2 DIABETES MELLITUS: You have been experiencing fluctuations in your morning blood sugar levels. You are currently taking Basaglar  insulin  and have made dietary changes that have helped stabilize your levels. -Continue taking Basaglar  insulin  at 16 units daily. -We will order a C-peptide test to assess your body's insulin  production.  RECENT COVID-19 INFECTION AND ACUTE SINUSITIS: You recently had a COVID-19 infection and a sinus infection, which caused extreme fatigue and an increased heart rate. -Continue to monitor your symptoms and take any prescribed medications for sinusitis as directed.

## 2024-01-17 NOTE — Assessment & Plan Note (Signed)
 Sugar now much improved with average glucose 145 in the last 2 weeks. Continue farxiga , actos , basaglar . Follow up in November as scheduled. She would like to check a c peptide level- wondering if her body stopped making insulin  given the sudden spike in her glucose. She continues to work on [employer] and exerise.

## 2024-01-17 NOTE — Assessment & Plan Note (Signed)
 Pap performed today.

## 2024-01-17 NOTE — Progress Notes (Addendum)
 Subjective:     Patient ID: Lindsey Burton, female    DOB: 02/04/1976, 48 y.o.   MRN: [id number]  No chief complaint on file.   HPI  Discussed the use of AI scribe software for clinical note transcription with the patient, who gave verbal consent to proceed.  History of Present Illness  Lindsey Burton is a 48 year old female with diabetes who presents for follow-up on her blood sugar management.  She uses a Freestyle glucose monitoring system with her sister-in-law's help. Initially, she experienced significant morning blood sugar spikes, increasing by over 100 points within five to ten minutes, which delayed her eating. After two weeks, her levels stabilized, allowing more frequent meals.  She has been adjusting her diet, finding that home-cooked meals cause less significant blood sugar spikes than eating out. She takes 16 units of Basaglar  insulin  daily, having previously been on Actos  alone.  Three months ago, her A1c levels were checked with no change in diet or activity level. She experienced increased thirst but no increased fatigue or other symptoms.  Recently, she had a sinus infection and COVID-19, resulting in extreme fatigue. She did not lose her sense of taste or smell and managed her symptoms with medication for the sinus infection.     Health Maintenance Due  Topic Date Due   Pneumococcal Vaccine: 50+ Years (1 of 2 - PCV) Never done   Hepatitis B Vaccines 19-59 Average Risk (1 of 3 - 19+ 3-dose series) Never done   Colonoscopy  Never done   Zoster Vaccines- Shingrix (1 of 2) Never done   Cervical Cancer Screening (HPV/Pap Cotest)  12/18/2023   COVID-19 Vaccine (3 - 2025-26 season) 12/18/2023    Past Medical History:  Diagnosis Date   Alopecia    Getting kenalog injections prn   Anxiety    Depression    Hypertension    Insomnia    Migraines    Migraines    Vitamin D  deficiency     Past Surgical History:  Procedure Laterality Date   LIPOSUCTION   09/2017    Family History  Problem Relation Age of Onset   Hypertension Father    Diabetes Father    Hyperlipidemia Father    Heart attack Father    Diabetes Sister    Hypertension Sister    Cancer Neg Hx     Social History   Socioeconomic History   Marital status: Divorced    Spouse name: Not on file   Number of children: Not on file   Years of education: Not on file   Highest education level: Some college, no degree  Occupational History   Not on file  Tobacco Use   Smoking status: Never   Smokeless tobacco: Never  Substance and Sexual Activity   Alcohol use: No   Drug use: No   Sexual activity: Yes    Partners: Male    Birth control/protection: Implant  Other Topics Concern   Not on file  Social History Narrative   Daughter, age 53 autistic Does well in school   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   Divorced, lives with daughter   Enjoys TV   Has fish   Social Drivers of Health   Financial Resource Strain: Low Risk  (11/28/2023)   Overall Financial Resource Strain (CARDIA)    Difficulty of Paying Living Expenses: Not very hard  Food Insecurity: No Food Insecurity (11/28/2023)   Hunger Vital Sign  Worried About Programme researcher, broadcasting/film/video in the Last Year: Never true    Ran Out of Food in the Last Year: Never true  Transportation Needs: No Transportation Needs (11/28/2023)   PRAPARE - Administrator, Civil Service (Medical): No    Lack of Transportation (Non-Medical): No  Physical Activity: Insufficiently Active (11/28/2023)   Exercise Vital Sign    Days of Exercise per Week: 3 days    Minutes of Exercise per Session: 30 min  Stress: No Stress Concern Present (11/28/2023)   [employer] of Occupational Health - Occupational Stress Questionnaire    Feeling of Stress: Only a little  Social Connections: Socially Isolated (11/28/2023)   Social Connection and Isolation Panel    Frequency of Communication with Friends and  Family: More than three times a week    Frequency of Social Gatherings with Friends and Family: Once a week    Attends Religious Services: Never    Database administrator or Organizations: No    Attends Engineer, structural: Not on file    Marital Status: Divorced  Intimate Partner Violence: Unknown (07/19/2021)   Received from [facility]   HITS    Physically Hurt: Not on file    Insult or Talk Down To: Not on file    Threaten Physical Harm: Not on file    Scream or Curse: Not on file    Outpatient Medications Prior to Visit  Medication Sig Dispense Refill   amLODipine  (NORVASC ) 10 MG tablet Take 1 tablet (10 mg total) by mouth daily. 90 tablet 1   atorvastatin  (LIPITOR) 80 MG tablet Take 1 tablet (80 mg total) by mouth daily. 90 tablet 1   betamethasone  dipropionate 0.05 % cream Apply topically 2 (two) times daily. 30 g 0   blood glucose meter kit and supplies KIT Use up to four times daily as directed to check blood sugar 1 each 0   carvedilol  (COREG ) 25 MG tablet Take 1 tablet (25 mg total) by mouth 2 (two) times daily with a meal. 60 tablet 3   Continuous Glucose Sensor (FREESTYLE LIBRE 3 PLUS SENSOR) MISC Change sensor every 15 days. 6 each 5   dapagliflozin  propanediol (FARXIGA ) 5 MG TABS tablet Take 1 tablet (5 mg total) by mouth daily. 30 tablet 5   escitalopram  (LEXAPRO ) 20 MG tablet Take 1 tablet (20 mg total) by mouth daily. 90 tablet 1   etonogestrel  (NEXPLANON ) 68 MG IMPL implant 1 each (68 mg total) by Subdermal route once for 1 dose. 1 each 0   fluticasone  (FLONASE ) 50 MCG/ACT nasal spray PLACE 2 SPRAYS INTO BOTH NOSTRILS DAILY 16 g 5   glucose blood (CONTOUR NEXT TEST) test strip Use up to 4 times a day as directed to check blood sugar 100 each 12   hydrALAZINE  (APRESOLINE ) 10 MG tablet Take 1 tablet (10 mg total) by mouth 3 (three) times daily. 90 tablet 2   hydrochlorothiazide  (HYDRODIURIL ) 25 MG tablet Take 1 tablet (25 mg total) by mouth daily. 90 tablet 1    Insulin  Glargine w/ Trans Port (BASAGLAR  TEMPO PEN) 100 UNIT/ML SOPN Inject 16 Units into the skin daily.     Insulin  Pen Needle 31G X 5 MM MISC Use once daily as directed 100 each 4   loratadine  (CLARITIN ) 10 MG tablet Take 1 tablet (10 mg total) by mouth daily.     meloxicam  (MOBIC ) 7.5 MG tablet Take 1 tablet (7.5 mg total) by mouth daily. 14 tablet  0   Microlet Lancets MISC Use up to 4 times a day as directed to check blood sugar 100 each 11   Multiple Vitamins-Minerals (MULTIVITAMIN WITH MINERALS) tablet Take 1 tablet by mouth daily. 30 tablet    pioglitazone  (ACTOS ) 30 MG tablet Take 1 tablet (30 mg total) by mouth daily. 90 tablet 1   SUMAtriptan  (IMITREX ) 100 MG tablet TAKE ONE TABLET BY MOUTH AT START OF HEADACHE, MAY REPEAT 2 HOURS LATER AS NEEDED. MAX 2 DOSE PER 24 HOURS 10 tablet 5   Tazarotene (ARAZLO) 0.045 % LOTN Apply topically.     Triamcinolone  Acetonide (KENALOG IJ) Inject 60 mg as directed. As needed or Every 1 1/2 months for Alopecia     zolpidem  (AMBIEN  CR) 12.5 MG CR tablet Take 1 tablet (12.5 mg total) by mouth at bedtime as needed. 30 tablet 2   No facility-administered medications prior to visit.    Allergies  Allergen Reactions   Ace Inhibitors Swelling    angioedema    Lisinopril Swelling   Metformin And Related     ROS See HPI    Objective:    Physical Exam Exam conducted with a chaperone present.  Constitutional:      Appearance: Normal appearance.  HENT:     Head: Normocephalic and atraumatic.  Cardiovascular:     Rate and Rhythm: Normal rate.  Pulmonary:     Effort: Pulmonary effort is normal.  Genitourinary:    General: Normal vulva.     Labia:        Right: No rash.        Left: No rash.      Vagina: Normal.     Cervix: Normal.     Uterus: Normal.      Adnexa: Right adnexa normal and left adnexa normal.  Skin:    General: Skin is warm and dry.  Neurological:     General: No focal deficit present.     Mental Status: She is alert  and oriented to person, place, and time.  Psychiatric:        Attention and Perception: Attention normal.        Mood and Affect: Mood normal.        Behavior: Behavior normal.        Cognition and Memory: Cognition normal.    Diabetic Foot Exam - Simple   Simple Foot Form Diabetic Foot exam was performed with the following findings: Yes 01/17/2024 10:51 AM  Visual Inspection No deformities, no ulcerations, no other skin breakdown bilaterally: Yes Sensation Testing Intact to touch and monofilament testing bilaterally: Yes Pulse Check Posterior Tibialis and Dorsalis pulse intact bilaterally: Yes Comments       BP 131/74 (BP Location: Right Arm, Patient Position: Sitting, Cuff Size: Normal)   Pulse 85   Temp 99.3 F (37.4 C) (Oral)   Resp 16   Ht 5' 2 (1.575 m)   Wt 154 lb (69.9 kg)   SpO2 98%   BMI 28.17 kg/m  Wt Readings from Last 3 Encounters:  01/17/24 154 lb (69.9 kg)  11/29/23 153 lb (69.4 kg)  09/13/23 161 lb (73 kg)       Assessment & Plan:   Problem List Items Addressed This Visit       Unprioritized   Type 2 diabetes mellitus without complication, without long-term current use of insulin  (HCC)   Sugar now much improved with average glucose 145 in the last 2 weeks. Continue farxiga , actos , basaglar . Follow up in  November as scheduled. She would like to check a c peptide level- wondering if her body stopped making insulin  given the sudden spike in her glucose. She continues to work on [employer] and exerise.      Encounter for routine gynecological examination with Papanicolaou smear of cervix   Pap performed today.       Relevant Orders   Cytology - PAP( [place])   Other Visit Diagnoses       Type 2 diabetes mellitus with diabetic microalbuminuria, without long-term current use of insulin  (HCC)    -  Primary   Relevant Orders   C-peptide       I am having Lindsey Burton maintain her Triamcinolone  Acetonide (KENALOG IJ), multivitamin  with minerals, Arazlo, Nexplanon , Contour Next Test, Microlet Lancets, SUMAtriptan , blood glucose meter kit and supplies, betamethasone  dipropionate, escitalopram , carvedilol , hydrALAZINE , loratadine , zolpidem , atorvastatin , hydrochlorothiazide , fluticasone , meloxicam , pioglitazone , amLODipine , FreeStyle Libre 3 Plus Sensor, Insulin  Pen Needle, dapagliflozin  propanediol, and Basaglar  Tempo Pen.  No orders of the defined types were placed in this encounter.

## 2024-01-18 LAB — CYTOLOGY - PAP
Adequacy: ABSENT
Comment: NEGATIVE
Diagnosis: NEGATIVE
High risk HPV: NEGATIVE

## 2024-01-19 LAB — C-PEPTIDE: C-Peptide: 3.08 ng/mL (ref 0.80–3.85)

## 2024-01-22 ENCOUNTER — Ambulatory Visit: Payer: Self-pay | Admitting: Family

## 2024-01-30 ENCOUNTER — Other Ambulatory Visit (HOSPITAL_BASED_OUTPATIENT_CLINIC_OR_DEPARTMENT_OTHER): Payer: Self-pay

## 2024-01-31 ENCOUNTER — Other Ambulatory Visit (HOSPITAL_BASED_OUTPATIENT_CLINIC_OR_DEPARTMENT_OTHER): Payer: Self-pay

## 2024-01-31 ENCOUNTER — Other Ambulatory Visit: Payer: Self-pay | Admitting: Family

## 2024-01-31 DIAGNOSIS — G4700 Insomnia, unspecified: Secondary | ICD-10-CM

## 2024-01-31 DIAGNOSIS — E119 Type 2 diabetes mellitus without complications: Secondary | ICD-10-CM

## 2024-01-31 MED ORDER — BASAGLAR TEMPO PEN 100 UNIT/ML ~~LOC~~ SOPN
16.0000 [IU] | PEN_INJECTOR | Freq: Every day | SUBCUTANEOUS | 3 refills | Status: AC
  Filled 2024-01-31: qty 6, 37d supply, fill #0
  Filled 2024-04-02: qty 6, 37d supply, fill #1
  Filled 2024-05-01: qty 6, 37d supply, fill #2

## 2024-01-31 MED ORDER — ZOLPIDEM TARTRATE ER 12.5 MG PO TBCR
12.5000 mg | EXTENDED_RELEASE_TABLET | Freq: Every evening | ORAL | 2 refills | Status: AC | PRN
  Filled 2024-01-31: qty 30, 30d supply, fill #0
  Filled 2024-04-02: qty 30, 30d supply, fill #1

## 2024-01-31 NOTE — Telephone Encounter (Signed)
 Requesting: Ambien  12.5mg   Contract:11/29/23 UDS:11/29/23 Last Visit: 01/17/24 Next Visit: 02/21/24 Last Refill: 07/05/23 #30 and 1RF   Please Advise

## 2024-02-07 ENCOUNTER — Encounter: Payer: Self-pay | Admitting: Family

## 2024-02-21 ENCOUNTER — Encounter: Payer: Self-pay | Admitting: Family

## 2024-02-21 ENCOUNTER — Ambulatory Visit: Admitting: Family

## 2024-02-21 VITALS — BP 125/87 | HR 86 | Temp 98.6°F | Resp 16 | Ht 62.0 in | Wt 155.2 lb

## 2024-02-21 DIAGNOSIS — Z3046 Encounter for surveillance of implantable subdermal contraceptive: Secondary | ICD-10-CM

## 2024-02-21 DIAGNOSIS — Z30017 Encounter for initial prescription of implantable subdermal contraceptive: Secondary | ICD-10-CM

## 2024-02-21 NOTE — Progress Notes (Signed)
 Subjective:     Patient ID: Lindsey Burton, female    DOB: 02-Apr-1976, 48 y.o.   MRN: [id number]  Chief Complaint  Patient presents with   Follow-up    Patient is here for Nexplanon  replacement, Left arm    HPI  Discussed the use of AI scribe software for clinical note transcription with the patient, who gave verbal consent to proceed.  History of Present Illness   Patient presents today for removal of old Nexplanon  and re-insertion of new Nexplanon .     Health Maintenance Due  Topic Date Due   Pneumococcal Vaccine: 50+ Years (1 of 2 - PCV) Never done   Hepatitis B Vaccines 19-59 Average Risk (1 of 3 - 19+ 3-dose series) Never done   Colonoscopy  Never done   Zoster Vaccines- Shingrix (1 of 2) Never done   COVID-19 Vaccine (3 - 2025-26 season) 12/18/2023    Past Medical History:  Diagnosis Date   Alopecia    Getting kenalog injections prn   Anxiety    Depression    Hypertension    Insomnia    Migraines    Migraines    Vitamin D  deficiency     Past Surgical History:  Procedure Laterality Date   LIPOSUCTION  09/2017    Family History  Problem Relation Age of Onset   Hypertension Father    Diabetes Father    Hyperlipidemia Father    Heart attack Father    Diabetes Sister    Hypertension Sister    Cancer Neg Hx     Social History   Socioeconomic History   Marital status: Divorced    Spouse name: Not on file   Number of children: Not on file   Years of education: Not on file   Highest education level: Some college, no degree  Occupational History   Not on file  Tobacco Use   Smoking status: Never   Smokeless tobacco: Never  Substance and Sexual Activity   Alcohol use: No   Drug use: No   Sexual activity: Yes    Partners: Male    Birth control/protection: Implant  Other Topics Concern   Not on file  Social History Narrative   Daughter, age 70 autistic Does well in school   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    Divorced, lives with daughter   Enjoys TV   Has fish   Social Drivers of Corporate Investment Banker Strain: Low Risk  (02/20/2024)   Overall Financial Resource Strain (CARDIA)    Difficulty of Paying Living Expenses: Not very hard  Food Insecurity: No Food Insecurity (02/20/2024)   Hunger Vital Sign    Worried About Running Out of Food in the Last Year: Never true    Ran Out of Food in the Last Year: Never true  Transportation Needs: No Transportation Needs (02/20/2024)   PRAPARE - Administrator, Civil Service (Medical): No    Lack of Transportation (Non-Medical): No  Physical Activity: Insufficiently Active (02/20/2024)   Exercise Vital Sign    Days of Exercise per Week: 3 days    Minutes of Exercise per Session: 40 min  Stress: No Stress Concern Present (02/20/2024)   [employer] of Occupational Health - Occupational Stress Questionnaire    Feeling of Stress: Only a little  Social Connections: Socially Isolated (02/20/2024)   Social Connection and Isolation Panel    Frequency of Communication with Friends and Family: More than  three times a week    Frequency of Social Gatherings with Friends and Family: Once a week    Attends Religious Services: Never    Database Administrator or Organizations: No    Attends Engineer, Structural: Not on file    Marital Status: Divorced  Intimate Partner Violence: Unknown (07/19/2021)   Received from [facility]   HITS    Physically Hurt: Not on file    Insult or Talk Down To: Not on file    Threaten Physical Harm: Not on file    Scream or Curse: Not on file    Outpatient Medications Prior to Visit  Medication Sig Dispense Refill   amLODipine  (NORVASC ) 10 MG tablet Take 1 tablet (10 mg total) by mouth daily. 90 tablet 1   atorvastatin  (LIPITOR) 80 MG tablet Take 1 tablet (80 mg total) by mouth daily. 90 tablet 1   betamethasone  dipropionate 0.05 % cream Apply topically 2 (two) times daily. 30 g 0   blood glucose  meter kit and supplies KIT Use up to four times daily as directed to check blood sugar 1 each 0   carvedilol  (COREG ) 25 MG tablet Take 1 tablet (25 mg total) by mouth 2 (two) times daily with a meal. 60 tablet 3   Continuous Glucose Sensor (FREESTYLE LIBRE 3 PLUS SENSOR) MISC Change sensor every 15 days. 6 each 5   dapagliflozin  propanediol (FARXIGA ) 5 MG TABS tablet Take 1 tablet (5 mg total) by mouth daily. 30 tablet 5   escitalopram  (LEXAPRO ) 20 MG tablet Take 1 tablet (20 mg total) by mouth daily. 90 tablet 1   fluticasone  (FLONASE ) 50 MCG/ACT nasal spray PLACE 2 SPRAYS INTO BOTH NOSTRILS DAILY 48 g 1   glucose blood (CONTOUR NEXT TEST) test strip Use up to 4 times a day as directed to check blood sugar 100 each 12   hydrALAZINE  (APRESOLINE ) 10 MG tablet Take 1 tablet (10 mg total) by mouth 3 (three) times daily. 90 tablet 2   hydrochlorothiazide  (HYDRODIURIL ) 25 MG tablet Take 1 tablet (25 mg total) by mouth daily. 90 tablet 1   Insulin  Glargine w/ Trans Port (BASAGLAR  TEMPO PEN) 100 UNIT/ML SOPN Inject 16 Units into the skin daily. 6 mL 3   Insulin  Pen Needle 31G X 5 MM MISC Use once daily as directed 100 each 4   loratadine  (CLARITIN ) 10 MG tablet Take 1 tablet (10 mg total) by mouth daily.     meloxicam  (MOBIC ) 7.5 MG tablet Take 1 tablet (7.5 mg total) by mouth daily. 14 tablet 0   Microlet Lancets MISC Use up to 4 times a day as directed to check blood sugar 100 each 11   Multiple Vitamins-Minerals (MULTIVITAMIN WITH MINERALS) tablet Take 1 tablet by mouth daily. 30 tablet    pioglitazone  (ACTOS ) 30 MG tablet Take 1 tablet (30 mg total) by mouth daily. 90 tablet 1   SUMAtriptan  (IMITREX ) 100 MG tablet TAKE ONE TABLET BY MOUTH AT START OF HEADACHE, MAY REPEAT 2 HOURS LATER AS NEEDED. MAX 2 DOSE PER 24 HOURS 10 tablet 5   Tazarotene (ARAZLO) 0.045 % LOTN Apply topically.     Triamcinolone  Acetonide (KENALOG IJ) Inject 60 mg as directed. As needed or Every 1 1/2 months for Alopecia      zolpidem  (AMBIEN  CR) 12.5 MG CR tablet Take 1 tablet (12.5 mg total) by mouth at bedtime as needed. 30 tablet 2   etonogestrel  (NEXPLANON ) 68 MG IMPL implant 1 each (68  mg total) by Subdermal route once for 1 dose. 1 each 0   No facility-administered medications prior to visit.    Allergies  Allergen Reactions   Ace Inhibitors Swelling    angioedema    Lisinopril Swelling   Metformin And Related     ROS See HPI    Objective:    Physical Exam Constitutional:      Appearance: Normal appearance.  Cardiovascular:     Rate and Rhythm: Normal rate.  Pulmonary:     Effort: Pulmonary effort is normal.  Skin:    Comments: Old nexplanon  palpated and marked on arm  Neurological:     Mental Status: She is alert.  Psychiatric:        Mood and Affect: Mood normal.        Behavior: Behavior normal.        Thought Content: Thought content normal.        Judgment: Judgment normal.      BP 125/87 (BP Location: Left Arm, Patient Position: Sitting, Cuff Size: Normal)   Pulse 86   Temp 98.6 F (37 C) (Oral)   Resp 16   Ht 5' 2 (1.575 m)   Wt 155 lb 3.2 oz (70.4 kg)   SpO2 100%   BMI 28.39 kg/m  Wt Readings from Last 3 Encounters:  02/21/24 155 lb 3.2 oz (70.4 kg)  01/17/24 154 lb (69.9 kg)  11/29/23 153 lb (69.4 kg)   Procedure: Patient was placed in supine position. The device was palpated and marked.  Her right arm was flexed at the elbow and externally rotated so that her wrist was parallel to her ear. The site was cleaned with Betadine. 1 cc of 1% lidocaine with epinephrine was injected just under the device. A scalpel was used to create a small incision.  Fibrous tissue surrounding the device was carefully removed allowing the device to be freed. The device was removed, measured and demonstrated to patient. Hemostasis achieved.  There was less than 5cc of blood loss.     Nexplanon  Insertion   The patient denies any allergies to anesthetics or antiseptics.   The risks &  benefits of Nexplanon  insertion/use were discussed with the patient, who has elected to proceed with placement. Packaging instructions/ card supplied to patient.  A signed consent was obtained and was placed in patient's medical chart.   Procedure: 1% lidocaine with epinephrine was injected just under the skin at the insertion site extending 4 cm proximally. The sterile preloaded disposable Nexaplanon applicator was removed from the sterile packaging and the applicator needle was inserted at a 30 degree angle. The applicator was lowered to a horizontal position and advanced just under the skin for the full length of the needle. The slider of the applicator was retracted fully while the applicator remained in the same position.  The applicator was then removed.  Hemostasis was confirmed.  Position of the Nexplanon  device was confirmed by palpation and was demonstrated to the patient.   2 sutures were placed to close incision, pressure dressing/hemostasis achieved.     There were no immediate complications.  Nexplanon  lot # [id number] exp 01/2025    Assessment & Plan:   Problem List Items Addressed This Visit   None Visit Diagnoses       Encounter for Nexplanon  removal    -  Primary     Insertion of Nexplanon          Patient tolerated procedure well.   I am having Dulcy  Maranan maintain her Triamcinolone  Acetonide (KENALOG IJ), multivitamin with minerals, Arazlo, Nexplanon , Contour Next Test, Microlet Lancets, SUMAtriptan , blood glucose meter kit and supplies, betamethasone  dipropionate, escitalopram , carvedilol , hydrALAZINE , loratadine , atorvastatin , hydrochlorothiazide , meloxicam , pioglitazone , amLODipine , FreeStyle Libre 3 Plus Sensor, Insulin  Pen Needle, dapagliflozin  propanediol, fluticasone , zolpidem , and Basaglar  Tempo Pen.  No orders of the defined types were placed in this encounter.

## 2024-02-21 NOTE — Patient Instructions (Signed)
 Keep area dry for 72 hours.   Call if you develop bleeding/drainage or odor from the site.

## 2024-02-28 ENCOUNTER — Ambulatory Visit: Admitting: Family

## 2024-03-04 ENCOUNTER — Other Ambulatory Visit (HOSPITAL_BASED_OUTPATIENT_CLINIC_OR_DEPARTMENT_OTHER): Payer: Self-pay

## 2024-03-06 ENCOUNTER — Other Ambulatory Visit (HOSPITAL_BASED_OUTPATIENT_CLINIC_OR_DEPARTMENT_OTHER): Payer: Self-pay

## 2024-04-02 ENCOUNTER — Other Ambulatory Visit: Payer: Self-pay

## 2024-04-02 ENCOUNTER — Other Ambulatory Visit (HOSPITAL_BASED_OUTPATIENT_CLINIC_OR_DEPARTMENT_OTHER): Payer: Self-pay

## 2024-04-03 ENCOUNTER — Other Ambulatory Visit (HOSPITAL_BASED_OUTPATIENT_CLINIC_OR_DEPARTMENT_OTHER): Payer: Self-pay

## 2024-04-17 ENCOUNTER — Other Ambulatory Visit (HOSPITAL_BASED_OUTPATIENT_CLINIC_OR_DEPARTMENT_OTHER): Payer: Self-pay

## 2024-05-01 ENCOUNTER — Telehealth (HOSPITAL_BASED_OUTPATIENT_CLINIC_OR_DEPARTMENT_OTHER): Payer: Self-pay

## 2024-05-01 ENCOUNTER — Other Ambulatory Visit (HOSPITAL_BASED_OUTPATIENT_CLINIC_OR_DEPARTMENT_OTHER): Payer: Self-pay

## 2024-05-01 NOTE — Telephone Encounter (Signed)
 Pharmacy Patient Advocate Encounter   Received notification from Pt Calls Messages that prior authorization for FreeStyle Libre 3 Plus Sensor  is required/requested.   Insurance verification completed.   The patient is insured through [employer].   Per test claim: PA required; PA submitted to above mentioned insurance via Fax Key/confirmation #/[id number] -- Status is pending *faxed to [phone]

## 2024-05-08 ENCOUNTER — Other Ambulatory Visit (HOSPITAL_BASED_OUTPATIENT_CLINIC_OR_DEPARTMENT_OTHER): Payer: Self-pay

## 2024-05-08 ENCOUNTER — Other Ambulatory Visit (HOSPITAL_COMMUNITY): Payer: Self-pay

## 2024-05-08 NOTE — Telephone Encounter (Signed)
 Pharmacy Patient Advocate Encounter  Received notification from [employer] that Prior Authorization for  FreeStyle Libre 3 Plus Sensor  has been APPROVED from 05/03/24 to 04/18/25. Ran test claim, Copay is $39.99. This test claim was processed through [facility]- copay amounts may vary at other pharmacies due to pharmacy/plan contracts, or as the patient moves through the different stages of their insurance plan.   PA #/Case ID/Reference #: --
